# Patient Record
Sex: Male | Born: 1985
Health system: Southern US, Community
[De-identification: ages and names within clinical notes are randomized; demographics above are authoritative.]

## PROBLEM LIST (undated history)

## (undated) DIAGNOSIS — T7840XA Allergy, unspecified, initial encounter: Secondary | ICD-10-CM

## (undated) DIAGNOSIS — M5136 Other intervertebral disc degeneration, lumbar region: Secondary | ICD-10-CM

## (undated) DIAGNOSIS — R7303 Prediabetes: Secondary | ICD-10-CM

## (undated) HISTORY — DX: Allergy, unspecified, initial encounter: T78.40XA

---
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
## 2005-02-02 ENCOUNTER — Ambulatory Visit: Payer: Self-pay | Admitting: Family Medicine

## 2013-12-24 ENCOUNTER — Ambulatory Visit (INDEPENDENT_AMBULATORY_CARE_PROVIDER_SITE_OTHER): Payer: BC Managed Care – PPO | Admitting: Family Medicine

## 2013-12-24 VITALS — BP 142/65 | HR 89 | Temp 100.8°F | Resp 18 | Ht 66.5 in | Wt 298.0 lb

## 2013-12-24 DIAGNOSIS — J209 Acute bronchitis, unspecified: Secondary | ICD-10-CM

## 2013-12-24 LAB — POCT CBC
Granulocyte percent: 74.1 %G (ref 37–80)
HCT, POC: 39.3 % — AB (ref 43.5–53.7)
Hemoglobin: 12.2 g/dL — AB (ref 14.1–18.1)
Lymph, poc: 2.6 (ref 0.6–3.4)
MCH, POC: 27.4 pg (ref 27–31.2)
MCHC: 31 g/dL — AB (ref 31.8–35.4)
MCV: 88.1 fL (ref 80–97)
MID (cbc): 0.8 (ref 0–0.9)
MPV: 8.1 fL (ref 0–99.8)
POC Granulocyte: 9.6 — AB (ref 2–6.9)
POC LYMPH PERCENT: 19.8 %L (ref 10–50)
POC MID %: 6.1 %M (ref 0–12)
Platelet Count, POC: 435 10*3/uL — AB (ref 142–424)
RBC: 4.46 M/uL — AB (ref 4.69–6.13)
RDW, POC: 13.8 %
WBC: 13 10*3/uL — AB (ref 4.6–10.2)

## 2013-12-24 MED ORDER — AZITHROMYCIN 250 MG PO TABS: ORAL_TABLET | ORAL | Status: DC

## 2013-12-24 MED ORDER — HYDROCODONE-HOMATROPINE 5-1.5 MG/5ML PO SYRP: 5.0000 mL | ORAL_SOLUTION | Freq: Three times a day (TID) | ORAL | Status: DC | PRN

## 2013-12-24 NOTE — Addendum Note (Signed)
Addended by: Nita SellsSMITH, [place] S on: 12/24/2013 02:29 PM   Modules accepted: Orders

## 2013-12-24 NOTE — Patient Instructions (Signed)

## 2013-12-24 NOTE — Progress Notes (Signed)
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  Sometimes the cough is productive.  His boss recommended that he get checked out.  He has had some left shoulder soreness.  Nonsmoker, no h/o asthma  Objective:  NAD HEENT:  Scarred right TM, otherwise neg.  Throat is slightly reddened posteriorly Slightly hoarse Neck:  Without thyromegaly or adenopathy, supple Chest:  Few basilar rales, otherwise neg Cor:  No murmur, regular Skin:  No suspicious rashes  Acute bronchitis - Plan: azithromycin (ZITHROMAX Z-PAK) 250 MG tablet, HYDROcodone-homatropine (HYCODAN) 5-1.5 MG/5ML syrup  Signed, Elvina SidleKurt Camryn Lampson, MD

## 2013-12-27 ENCOUNTER — Telehealth: Payer: Self-pay

## 2013-12-27 ENCOUNTER — Encounter: Payer: Self-pay | Admitting: *Deleted

## 2013-12-27 NOTE — Telephone Encounter (Signed)
Letter written. Pt notified. Placed in pick up drawer.

## 2013-12-27 NOTE — Telephone Encounter (Signed)
Dr. Elbert EwingsL   Patient is requesting today out of work.  Medicine is working.  Gagging and coughing.  [phone]

## 2014-03-17 ENCOUNTER — Ambulatory Visit (INDEPENDENT_AMBULATORY_CARE_PROVIDER_SITE_OTHER): Payer: BC Managed Care – PPO | Admitting: Family Medicine

## 2014-03-17 VITALS — BP 102/82 | HR 79 | Temp 98.0°F | Resp 18 | Ht 66.5 in | Wt 297.2 lb

## 2014-03-17 DIAGNOSIS — J209 Acute bronchitis, unspecified: Secondary | ICD-10-CM

## 2014-03-17 DIAGNOSIS — R059 Cough, unspecified: Secondary | ICD-10-CM

## 2014-03-17 DIAGNOSIS — E669 Obesity, unspecified: Secondary | ICD-10-CM | POA: Insufficient documentation

## 2014-03-17 DIAGNOSIS — R05 Cough: Secondary | ICD-10-CM

## 2014-03-17 LAB — POCT CBC
Granulocyte percent: 57.4 %G (ref 37–80)
HCT, POC: 44 % (ref 43.5–53.7)
Hemoglobin: 14 g/dL — AB (ref 14.1–18.1)
Lymph, poc: 4.2 — AB (ref 0.6–3.4)
MCH, POC: 27.3 pg (ref 27–31.2)
MCHC: 31.8 g/dL (ref 31.8–35.4)
MCV: 86 fL (ref 80–97)
MID (cbc): 0.8 (ref 0–0.9)
MPV: 9.1 fL (ref 0–99.8)
POC Granulocyte: 6.7 (ref 2–6.9)
POC LYMPH PERCENT: 36 %L (ref 10–50)
POC MID %: 6.6 %M (ref 0–12)
Platelet Count, POC: 376 10*3/uL (ref 142–424)
RBC: 5.12 M/uL (ref 4.69–6.13)
RDW, POC: 14.6 %
WBC: 11.7 10*3/uL — AB (ref 4.6–10.2)

## 2014-03-17 MED ORDER — IPRATROPIUM BROMIDE 0.03 % NA SOLN: 2.0000 | Freq: Four times a day (QID) | NASAL | Status: DC

## 2014-03-17 MED ORDER — AZITHROMYCIN 250 MG PO TABS: ORAL_TABLET | ORAL | Status: DC

## 2014-03-17 NOTE — Patient Instructions (Addendum)
Try an OTC stool softener such as docusate sodium for your hard stools Use the nasal spray as needed, and the antibiotic.  Let me know if you do not feel better in the next few days.    Please work on your weight- your weight puts you at increased risk for diabetes, heart attack and stroke in the future.  Work towards losing 1 or 2 lbs a week

## 2014-03-17 NOTE — Progress Notes (Signed)
[facility] [address] [place] [address] [phone]  Date:  03/17/2014   Name:  Brian Harding   DOB:  December 26, 1985   MRN:  [id number]  PCP:  No PCP Per Patient    Chief Complaint: Cough and Nasal Congestion   History of Present Illness:  Brian Harding is a 28 y.o. very pleasant male patient who presents with the following:  Here today with illness. He has noted cough and sneeze at night, and nasal congestion during the night.  He "feels like I have a congestion in my chest."  He was seen here with bronchitis a few months ago.  He has not noted a fever and overall feels ok except for fatigue.   The cough is sometimes productive of mucus.  He can cough quite hard some of the time.   He does not note a ST or earache.  He has had some constipation recently- his stools have been hard.   He is a former smoker.   He has tried nyquil OTC.   He has noted some PND.    He is generally in good health  He has been trying to lose weight but is frustrated by yo-yo weight  There are no active problems to display for this patient.   History reviewed. No pertinent past medical history.  History reviewed. No pertinent past surgical history.  History  Substance Use Topics  . Smoking status: Former Games developer  . Smokeless tobacco: Not on file  . Alcohol Use: Yes    Family History  Problem Relation Age of Onset  . Diabetes Mother     No Known Allergies  Medication list has been reviewed and updated.  Current Outpatient Prescriptions on File Prior to Visit  Medication Sig Dispense Refill  . azithromycin (ZITHROMAX Z-PAK) 250 MG tablet Take as directed on pack  6 tablet  0  . HYDROcodone-homatropine (HYCODAN) 5-1.5 MG/5ML syrup Take 5 mLs by mouth every 8 (eight) hours as needed for cough.  120 mL  0   No current facility-administered medications on file prior to visit.    Review of Systems:  As per HPI- otherwise negative.   Physical Examination: Filed  Vitals:   03/17/14 1249  BP: 102/82  Pulse: 79  Temp: 98 F (36.7 C)  Resp: 18   Filed Vitals:   03/17/14 1249  Height: 5' 6.5" (1.689 m)  Weight: 297 lb 3.2 oz (134.809 kg)   Body mass index is 47.26 kg/(m^2). Ideal Body Weight: Weight in (lb) to have BMI = 25: 156.9  GEN: WDWN, NAD, Non-toxic, A & O x 3, obese, looks well HEENT: Atraumatic, Normocephalic. Neck supple. No masses, No LAD.  Bilateral TM wnl, oropharynx normal.  PEERL,EOMI.   Nasal congestion Ears and Nose: No external deformity. CV: RRR, No M/G/R. No JVD. No thrill. No extra heart sounds. PULM: CTA B, no wheezes, crackles, rhonchi. No retractions. No resp. distress. No accessory muscle use. ABD: S, NT, ND EXTR: No c/c/e NEURO Normal gait.  PSYCH: Normally interactive. Conversant. Not depressed or anxious appearing.  Calm demeanor.   Results for orders placed in visit on 03/17/14  POCT CBC      Result Value Ref Range   WBC 11.7 (*) 4.6 - 10.2 K/uL   Lymph, poc 4.2 (*) 0.6 - 3.4   POC LYMPH PERCENT 36.0  10 - 50 %L   MID (cbc) 0.8  0 - 0.9   POC MID % 6.6  0 - 12 %M   POC Granulocyte 6.7  2 - 6.9   Granulocyte percent 57.4  37 - 80 %G   RBC 5.12  4.69 - 6.13 M/uL   Hemoglobin 14.0 (*) 14.1 - 18.1 g/dL   HCT, POC 11.944.0  14.743.5 - 53.7 %   MCV 86.0  80 - 97 fL   MCH, POC 27.3  27 - 31.2 pg   MCHC 31.8  31.8 - 35.4 g/dL   RDW, POC 82.914.6     Platelet Count, POC 376  142 - 424 K/uL   MPV 9.1  0 - 99.8 fL    Assessment and Plan: Cough - Plan: POCT CBC, ipratropium (ATROVENT) 0.03 % nasal spray  Obesity, unspecified  Acute bronchitis - Plan: azithromycin (ZITHROMAX Z-PAK) 250 MG tablet   Treat for recurrent bronchitis with azithromycin.  Atrovent nasal as well for nasal sx.  Discussed weight loss strategies.  See patient instructions for more details.     Signed Abbe AmsterdamJessica Copland, MD

## 2014-06-17 ENCOUNTER — Emergency Department (HOSPITAL_BASED_OUTPATIENT_CLINIC_OR_DEPARTMENT_OTHER): Payer: BC Managed Care – PPO

## 2014-06-17 ENCOUNTER — Encounter (HOSPITAL_BASED_OUTPATIENT_CLINIC_OR_DEPARTMENT_OTHER): Payer: Self-pay | Admitting: Emergency Medicine

## 2014-06-17 ENCOUNTER — Emergency Department (HOSPITAL_BASED_OUTPATIENT_CLINIC_OR_DEPARTMENT_OTHER)
Admission: EM | Admit: 2014-06-17 | Discharge: 2014-06-17 | Disposition: A | Discharge status: Home / Self Care | Payer: BC Managed Care – PPO | Attending: Emergency Medicine | Admitting: Emergency Medicine

## 2014-06-17 DIAGNOSIS — J40 Bronchitis, not specified as acute or chronic: Secondary | ICD-10-CM

## 2014-06-17 DIAGNOSIS — J069 Acute upper respiratory infection, unspecified: Secondary | ICD-10-CM | POA: Diagnosis present

## 2014-06-17 DIAGNOSIS — Z87891 Personal history of nicotine dependence: Secondary | ICD-10-CM | POA: Diagnosis not present

## 2014-06-17 DIAGNOSIS — J209 Acute bronchitis, unspecified: Secondary | ICD-10-CM | POA: Insufficient documentation

## 2014-06-17 DIAGNOSIS — R0789 Other chest pain: Secondary | ICD-10-CM | POA: Insufficient documentation

## 2014-06-17 DIAGNOSIS — Z79899 Other long term (current) drug therapy: Secondary | ICD-10-CM | POA: Diagnosis not present

## 2014-06-17 MED ORDER — ALBUTEROL SULFATE HFA 108 (90 BASE) MCG/ACT IN AERS: 1.0000 | INHALATION_SPRAY | Freq: Four times a day (QID) | RESPIRATORY_TRACT | Status: DC | PRN

## 2014-06-17 MED ORDER — ALBUTEROL SULFATE (2.5 MG/3ML) 0.083% IN NEBU
5.0000 mg | INHALATION_SOLUTION | Freq: Once | RESPIRATORY_TRACT | Status: AC
  Administered 2014-06-17: 5 mg via RESPIRATORY_TRACT
  Filled 2014-06-17: qty 6

## 2014-06-17 MED ORDER — OXYMETAZOLINE HCL 0.05 % NA SOLN
1.0000 | Freq: Two times a day (BID) | NASAL | Status: DC
Start: 2014-06-17 — End: 2014-09-03

## 2014-06-17 MED ORDER — AZITHROMYCIN 250 MG PO TABS: ORAL_TABLET | ORAL | Status: DC

## 2014-06-17 NOTE — ED Notes (Signed)
Pt c/o URi symtpoms x 2 days

## 2014-06-17 NOTE — ED Provider Notes (Signed)
CSN: [id number]     Arrival date & time 06/17/14  2148 History   This chart was scribed for Brian Canal, MD by Roxy Cedar, ED Scribe. This patient was seen in room MH01/MH01 and the patient's care was started at 10:45 PM.  Chief Complaint  Patient presents with  . URI   The history is provided by the patient. No language interpreter was used.    HPI Comments: Brian Harding is a 28 y.o. male who presents to the Emergency Department complaining of congestion in upper respiratory and chest that began 2 days ago.  Patient states that he has a productive cough with yellow and green mucous. Patient states that he was a former smoker and currently uses a vapor. Patient states that he was treated for bronchitis a few months ago with steroid and antibiotic medication. Patient denies associated fever.   History reviewed. No pertinent past medical history. History reviewed. No pertinent past surgical history. Family History  Problem Relation Age of Onset  . Diabetes Mother    History  Substance Use Topics  . Smoking status: Former Games developer  . Smokeless tobacco: Not on file  . Alcohol Use: Yes    Review of Systems  Constitutional: Negative for fever.  Respiratory: Positive for cough, chest tightness and wheezing.   All other systems reviewed and are negative.   Allergies  Review of patient's allergies indicates no known allergies.  Home Medications   Prior to Admission medications   Medication Sig Start Date End Date Taking? Authorizing Provider  azithromycin (ZITHROMAX Z-PAK) 250 MG tablet Take as directed on pack 03/17/14   Gwenlyn Found Copland, MD  ipratropium (ATROVENT) 0.03 % nasal spray Place 2 sprays into the nose 4 (four) times daily. 03/17/14   Pearline Cables, MD   Triage Vitals: BP 123/73  Pulse 76  Temp(Src) 98 F (36.7 C) (Oral)  Resp 16  Ht  (1.676 m)  Wt 292 lb (132.45 kg)  BMI 47.15 kg/m2  SpO2 97%  Physical Exam  Nursing note and vitals  reviewed. Constitutional: He is oriented to person, place, and time. He appears well-developed and well-nourished. No distress.  HENT:  Head: Normocephalic and atraumatic.  Eyes: Conjunctivae and EOM are normal.  Neck: Neck supple. No tracheal deviation present.  Cardiovascular: Normal rate.   Pulmonary/Chest: Effort normal. He has wheezes.  Moderate air movement, minimal wheezing throughou  Musculoskeletal: Normal range of motion.  Neurological: He is alert and oriented to person, place, and time.  Skin: Skin is warm and dry.  Psychiatric: He has a normal mood and affect. His behavior is normal.    ED Course  Procedures (including critical care time)  DIAGNOSTIC STUDIES: Oxygen Saturation is 97% on RA, normal by my interpretation.    COORDINATION OF CARE: 10:48 PM- Discussed plan to administer nebulizer treatment. Pt advised of plan for treatment and pt agrees.  Labs Review Labs Reviewed - No data to display  Imaging Review Dg Chest 2 View  06/17/2014   CLINICAL DATA:  Upper respiratory infection  EXAM: CHEST  2 VIEW  COMPARISON:  None.  FINDINGS: The cardiac and mediastinal silhouettes are within normal limits.  The lungs are normally inflated. No airspace consolidation, pleural effusion, or pulmonary edema is identified. There is no pneumothorax.  No acute osseous abnormality identified.  IMPRESSION: No active cardiopulmonary disease.   Electronically Signed   By: Rise Mu M.D.   On: 06/17/2014 23:14     EKG Interpretation None  MDM   Final diagnoses:  None   Brian Harding is a 28 y.o. male here with cough, congestion. Minimal wheezing initially, improved after neb. Vitals stable. I think he has bronchitis. Will d/c with zpack, albuterol, pmd f/u.    I personally performed the services described in this documentation, which was scribed in my presence. The recorded information has been reviewed and is accurate.   Brian Canal, MD 06/17/14 2325

## 2014-06-17 NOTE — Discharge Instructions (Signed)
Take afrin for congestion.   Stay hydrated.  Use zpack as directed.   Use albuterol as needed for wheezing and trouble breathing.   Follow up with your doctor.   Return to ER if you have trouble breathing, severe pain, worse congestion.

## 2014-07-03 ENCOUNTER — Ambulatory Visit (INDEPENDENT_AMBULATORY_CARE_PROVIDER_SITE_OTHER): Payer: BC Managed Care – PPO | Admitting: Family Medicine

## 2014-07-03 ENCOUNTER — Ambulatory Visit (INDEPENDENT_AMBULATORY_CARE_PROVIDER_SITE_OTHER): Payer: BC Managed Care – PPO

## 2014-07-03 VITALS — BP 134/80 | HR 88 | Temp 98.2°F | Resp 17 | Ht 67.5 in | Wt 298.0 lb

## 2014-07-03 DIAGNOSIS — M545 Low back pain, unspecified: Secondary | ICD-10-CM

## 2014-07-03 DIAGNOSIS — R05 Cough: Secondary | ICD-10-CM

## 2014-07-03 DIAGNOSIS — J029 Acute pharyngitis, unspecified: Secondary | ICD-10-CM

## 2014-07-03 DIAGNOSIS — R0989 Other specified symptoms and signs involving the circulatory and respiratory systems: Secondary | ICD-10-CM

## 2014-07-03 DIAGNOSIS — M25561 Pain in right knee: Secondary | ICD-10-CM

## 2014-07-03 DIAGNOSIS — R5383 Other fatigue: Secondary | ICD-10-CM

## 2014-07-03 DIAGNOSIS — R109 Unspecified abdominal pain: Secondary | ICD-10-CM

## 2014-07-03 DIAGNOSIS — R0609 Other forms of dyspnea: Secondary | ICD-10-CM

## 2014-07-03 DIAGNOSIS — R0600 Dyspnea, unspecified: Secondary | ICD-10-CM

## 2014-07-03 DIAGNOSIS — R059 Cough, unspecified: Secondary | ICD-10-CM

## 2014-07-03 DIAGNOSIS — M25569 Pain in unspecified knee: Secondary | ICD-10-CM

## 2014-07-03 DIAGNOSIS — R5381 Other malaise: Secondary | ICD-10-CM

## 2014-07-03 LAB — POCT UA - MICROSCOPIC ONLY
Bacteria, U Microscopic: NEGATIVE
CRYSTALS, UR, HPF, POC: NEGATIVE
Casts, Ur, LPF, POC: NEGATIVE
Mucus, UA: NEGATIVE
RBC, URINE, MICROSCOPIC: NEGATIVE
WBC, Ur, HPF, POC: NEGATIVE
Yeast, UA: NEGATIVE

## 2014-07-03 LAB — POCT URINALYSIS DIPSTICK
Bilirubin, UA: NEGATIVE
Blood, UA: NEGATIVE
Glucose, UA: NEGATIVE
KETONES UA: NEGATIVE
Leukocytes, UA: NEGATIVE
Nitrite, UA: NEGATIVE
PROTEIN UA: NEGATIVE
SPEC GRAV UA: 1.02
Urobilinogen, UA: 0.2
pH, UA: 7

## 2014-07-03 LAB — POCT RAPID STREP A (OFFICE): Rapid Strep A Screen: NEGATIVE

## 2014-07-03 LAB — POCT CBC
GRANULOCYTE PERCENT: 60 % (ref 37–80)
HCT, POC: 42.8 % — AB (ref 43.5–53.7)
Hemoglobin: 13.9 g/dL — AB (ref 14.1–18.1)
Lymph, poc: 2.8 (ref 0.6–3.4)
MCH, POC: 27.6 pg (ref 27–31.2)
MCHC: 32.6 g/dL (ref 31.8–35.4)
MCV: 84.9 fL (ref 80–97)
MID (CBC): 0.6 (ref 0–0.9)
MPV: 7.5 fL (ref 0–99.8)
PLATELET COUNT, POC: 277 10*3/uL (ref 142–424)
POC GRANULOCYTE: 5.2 (ref 2–6.9)
POC LYMPH PERCENT: 33 %L (ref 10–50)
POC MID %: 7 % (ref 0–12)
RBC: 5.05 M/uL (ref 4.69–6.13)
RDW, POC: 14.3 %
WBC: 8.6 10*3/uL (ref 4.6–10.2)

## 2014-07-03 MED ORDER — MELOXICAM 7.5 MG PO TABS: 7.5000 mg | ORAL_TABLET | Freq: Every day | ORAL | Status: DC | PRN

## 2014-07-03 NOTE — Progress Notes (Signed)
Subjective:    Patient ID: Brian Harding, male    DOB: 1986/02/15, 28 y.o.   MRN: [id number]  HPI Samin Milke is a 28 y.o. male PCP: No PCP Per Patient Here with multiple concerns:   Presents with 4 day history of burning in throat, lower back pain on left, sneezing - hard sneezes, and occasional wheezes. Today at work - sweating, clammy feeling, sore throat/burning. Used albuterol once yesterday - helped some with wheeze. Feels shaky with taking inhaler. Short of breath walking around last night. Concerned about clammy feeling, subjective fever. No vomiting.  Upset stomach, few episodes of loose stools recently. No hematuria, or difficulty urinating, but has noticed darker urine recently. Drinking fluids, tea. New lower left back pain past few days, but has had some low back pain for years. Lower left back pain and clammy feeling are most concerning symptoms. No known sick contacts.  Seen in the ER about a month ago - treated for bronchitis, rx zpak and albuterol inhaler.   No bowel or bladder incontinence, no saddle anesthesia, no lower extremity weakness.   Knee pain - R knee, injured in high school - torn ligament? in 9th grade, bruised cartilege and growth plate? Did not have surgery. Pain in R knee for past few months, pain in front part of knee at times. Pops with standing or sitting down at times.  Feels more pain after standing for some time. Possible instability at times. 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  Exercise - able to squats and leg presses. Tx: occasional tylenol. Over the counter knee brace helps.   Some difficulty with history - clarification with follow up questioning.  Patient Active Problem List   Diagnosis Date Noted  . Obesity, unspecified 03/17/2014   No past medical history on file. No past surgical history on file. No Known Allergies Prior to Admission medications   Medication Sig Start Date End Date Taking? Authorizing Provider  albuterol (PROVENTIL HFA;VENTOLIN HFA)  108 (90 BASE) MCG/ACT inhaler Inhale 1-2 puffs into the lungs every 6 (six) hours as needed for wheezing or shortness of breath. 06/17/14  Yes [name], MD  azithromycin (ZITHROMAX Z-PAK) 250 MG tablet Take as directed on pack 03/17/14  Yes Gwenlyn Found Copland, MD  azithromycin (ZITHROMAX Z-PAK) 250 MG tablet 2 po day one, then 1 daily x 4 days 06/17/14  Yes [name], MD  ipratropium (ATROVENT) 0.03 % nasal spray Place 2 sprays into the nose 4 (four) times daily. 03/17/14  Yes Gwenlyn Found Copland, MD  oxymetazoline (AFRIN NASAL SPRAY) 0.05 % nasal spray Place 1 spray into both nostrils 2 (two) times daily. 06/17/14  Yes [name], MD   History   Social History  . Marital Status: Single    Spouse Name: N/A    Number of Children: N/A  . Years of Education: N/A   Occupational History  . Not on file.   Social History Main Topics  . Smoking status: Former Games developer  . Smokeless tobacco: Not on file  . Alcohol Use: Yes  . Drug Use: No  . Sexual Activity: Not on file   Other Topics Concern  . Not on file   Social History Narrative  . No narrative on file       Review of Systems  Constitutional: Positive for chills and diaphoresis. Negative for fever.  HENT: Positive for sneezing and sore throat.   Respiratory: Positive for shortness of breath and wheezing.   Gastrointestinal: Positive for nausea.  Negative for vomiting and abdominal pain (upset stomach, not painful. ).  Musculoskeletal: Positive for arthralgias (r knee), back pain and myalgias.  Skin: Negative for rash.       Objective:   Physical Exam  Vitals reviewed. Constitutional: He is oriented to person, place, and time. He appears well-developed and well-nourished. No distress.  Overweight/obese  HENT:  Head: Normocephalic and atraumatic.  Right Ear: Tympanic membrane, external ear and ear canal normal.  Left Ear: Tympanic membrane, external ear and ear canal normal.  Nose: No rhinorrhea.  Mouth/Throat: Oropharynx is  clear and moist and mucous membranes are normal. No oropharyngeal exudate or posterior oropharyngeal erythema.  Eyes: Conjunctivae are normal. Pupils are equal, round, and reactive to light.  Neck: Normal range of motion. Neck supple.  Cardiovascular: Normal rate, regular rhythm, normal heart sounds and intact distal pulses.   No murmur heard. Pulmonary/Chest: Effort normal and breath sounds normal. He has no wheezes. He has no rhonchi. He has no rales.  Abdominal: Soft. There is no tenderness. There is CVA tenderness (more into L paraspinal mm than flank. ).  Musculoskeletal: He exhibits tenderness.       Right knee: He exhibits bony tenderness and abnormal meniscus (clicking with Mcmurray, but felt to be below patella. ). He exhibits normal range of motion, no swelling, no effusion and no deformity. Tenderness (patellar tendon, medial jt line. negative clarks, but slight discomfort with patellar grind. ) found. Medial joint line and patellar tendon tenderness noted.       Lumbar back: He exhibits tenderness. He exhibits normal range of motion and no bony tenderness.       Back:  Lymphadenopathy:    He has no cervical adenopathy.  Neurological: He is alert and oriented to person, place, and time. He has normal strength. Tremors: negative SLR.  No sensory deficit.  Reflex Scores:      Patellar reflexes are 2+ on the right side and 2+ on the left side.      Achilles reflexes are 2+ on the right side and 2+ on the left side. Able to heel and toe walk without difficulty.  Skin: Skin is warm and dry. No rash noted. He is not diaphoretic.  Psychiatric: He has a normal mood and affect. His behavior is normal.   Filed Vitals:   07/03/14 1118  BP: 134/80  Pulse: 88  Temp: 98.2 F (36.8 C)  TempSrc: Oral  Resp: 17  Height: 5' 7.5" (1.715 m)  Weight: 298 lb (135.172 kg)  SpO2: 98%   Results for orders placed in visit on 07/03/14  POCT RAPID STREP A (OFFICE)      Result Value Ref Range    Rapid Strep A Screen Negative  Negative  POCT UA - MICROSCOPIC ONLY      Result Value Ref Range   WBC, Ur, HPF, POC neg     RBC, urine, microscopic neg     Bacteria, U Microscopic neg     Mucus, UA neg     Epithelial cells, urine per micros 0-1     Crystals, Ur, HPF, POC neg     Casts, Ur, LPF, POC neg     Yeast, UA neg    POCT URINALYSIS DIPSTICK      Result Value Ref Range   Color, UA yellow     Clarity, UA clear     Glucose, UA neg     Bilirubin, UA neg     Ketones, UA neg  Spec Grav, UA 1.020     Blood, UA neg     pH, UA 7.0     Protein, UA neg     Urobilinogen, UA 0.2     Nitrite, UA neg     Leukocytes, UA Negative    POCT CBC      Result Value Ref Range   WBC 8.6  4.6 - 10.2 K/uL   Lymph, poc 2.8  0.6 - 3.4   POC LYMPH PERCENT 33.0  10 - 50 %L   MID (cbc) 0.6  0 - 0.9   POC MID % 7.0  0 - 12 %M   POC Granulocyte 5.2  2 - 6.9   Granulocyte percent 60.0  37 - 80 %G   RBC 5.05  4.69 - 6.13 M/uL   Hemoglobin 13.9 (*) 14.1 - 18.1 g/dL   HCT, POC 40.9 (*) 81.1 - 53.7 %   MCV 84.9  80 - 97 fL   MCH, POC 27.6  27 - 31.2 pg   MCHC 32.6  31.8 - 35.4 g/dL   RDW, POC 91.4     Platelet Count, POC 277  142 - 424 K/uL   MPV 7.5  0 - 99.8 fL   [facility] reading (PRIMARY) by  Dr. Neva Seat: R knee: lateral patellar tilt, possible small osteochondral defect of lateral femoral condyle. No acute findings.      Assessment & Plan:   Anne Sebring is a 28 y.o. male Cough , Dyspnea,  - Malaise and fatigue, Sore throat POCT CBC, Culture, Group A Strep,  -suspected viral illness. Reassuring VS including normal pulse ox, and CBC overall wnl. Sx care discussed. If wheezing or dyspnea returns - has albuterol and discussed trial of 1-2 puffs as needed - SED. Other sx care per AVS and RTC precautions discussed.   Low back pain, Left flank pain - Plan: POCT UA - Microscopic Only, POCT urinalysis dipstick, POCT CBC, Culture, Group A Strep, - Plan: POCT CBC, Culture, Group A Strep, meloxicam  (MOBIC) 7.5 MG tablet  -paraspinal strain, possibly form cough.  Reassuring U/A and exam. Sx care, back care manual given and mobic if needed. rtc if persists.    Knee pain, right - Plan: POCT CBC, Culture, Group A Strep, meloxicam (MOBIC) 7.5 MG tablet -longstanding. 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 Trial of mobic, otc knee brace if needed and recheck if not improving in next few weeks.   Borderline anemia - recheck HGB in 4-6 weeks. Sooner if symptomatic, or above sx's worsen.   Meds ordered this encounter  Medications  . meloxicam (MOBIC) 7.5 MG tablet    Sig: Take 1 tablet (7.5 mg total) by mouth daily as needed for pain.    Dispense:  30 tablet    Refill:  0   Patient Instructions  Your tests indicate this is most likely a virus. Tylenol, sore throat lozenges, fluids and rest are important.  If you do feel short of breath or wheezing - can use inhaler - 1 to 2 puffs up to every 4 hours if needed. Return to the clinic or go to the nearest emergency room if any of your symptoms worsen or new symptoms occur. You should receive a call or letter about your lab results(throat culture) within the next week to 10 days.   Hemoglobin (blood  count) borderline low, but close to normal.  Can recheck this with a primary care provider or here in next 4-6 weeks.     Viral Infections A viral infection can be caused by different types of viruses.Most viral infections are not serious and resolve on their own. However, some infections may cause severe symptoms and may lead to further complications. SYMPTOMS Viruses can frequently cause:  Minor sore throat.  Aches and pains.  Headaches.  Runny nose.  Different types of rashes.  Watery eyes.  Tiredness.  Cough.  Loss of appetite.  Gastrointestinal infections, resulting  in nausea, vomiting, and diarrhea. These symptoms do not respond to antibiotics because the infection is not caused by bacteria. However, you might catch a bacterial infection following the viral infection. This is sometimes called a "superinfection." Symptoms of such a bacterial infection may include:  Worsening sore throat with pus and difficulty swallowing.  Swollen neck glands.  Chills and a high or persistent fever.  Severe headache.  Tenderness over the sinuses.  Persistent overall ill feeling (malaise), muscle aches, and tiredness (fatigue).  Persistent cough.  Yellow, green, or brown mucus production with coughing. HOME CARE INSTRUCTIONS   Only take over-the-counter or prescription medicines for pain, discomfort, diarrhea, or fever as directed by your caregiver.  Drink enough water and fluids to keep your urine clear or pale yellow. Sports drinks can provide valuable electrolytes, sugars, and hydration.  Get plenty of rest and maintain proper nutrition. Soups and broths with crackers or rice are fine. SEEK IMMEDIATE MEDICAL CARE IF:   You have severe headaches, shortness of breath, chest pain, neck pain, or an unusual rash.  You have uncontrolled vomiting, diarrhea, or you are unable to keep down fluids.  You or your child has an oral temperature above 102 F (38.9 C), not controlled by medicine.  Your baby is older than 3 months with a rectal temperature of 102 F (38.9 C) or higher.  Your baby is 40 months old or younger with a rectal temperature of 100.4 F (38 C) or higher. MAKE SURE YOU:   Understand these instructions.  Will watch your condition.  Will get help right away if you are not doing well or get worse. Document Released: 07/13/2005 Document Revised: 12/26/2011 Document Reviewed: 02/07/2011 [facility] Patient Information 2015 [place], [place]. This information is not intended to replace advice given to you by your health care provider. Make sure you  discuss any questions you have with your health care provider.    Sore Throat A sore throat is pain, burning, irritation, or scratchiness of the throat. There is often pain or tenderness when swallowing or talking. A sore throat may be accompanied by other symptoms, such as coughing, sneezing, fever, and swollen neck glands. A sore throat is often the first sign of another sickness, such as a cold, flu, strep throat, or mononucleosis (commonly known as mono). Most sore throats go away without medical treatment. CAUSES  The most common causes of a sore throat include:  A viral infection, such as a cold, flu, or mono.  A bacterial infection, such as strep throat, tonsillitis, or whooping cough.  Seasonal allergies.  Dryness in the air.  Irritants, such as smoke or pollution.  Gastroesophageal reflux disease (GERD). HOME CARE INSTRUCTIONS   Only take over-the-counter medicines as directed by your caregiver.  Drink enough fluids to keep your urine clear or pale yellow.  Rest as needed.  Try using throat sprays, lozenges, or sucking on hard candy to  ease any pain (if older than 4 years or as directed).  Sip warm liquids, such as broth, herbal tea, or warm water with honey to relieve pain temporarily. You may also eat or drink cold or frozen liquids such as frozen ice pops.  Gargle with salt water (mix 1 tsp salt with 8 oz of water).  Do not smoke and avoid secondhand smoke.  Put a cool-mist humidifier in your bedroom at night to moisten the air. You can also turn on a hot shower and sit in the bathroom with the door closed for 5-10 minutes. SEEK IMMEDIATE MEDICAL CARE IF:  You have difficulty breathing.  You are unable to swallow fluids, soft foods, or your saliva.  You have increased swelling in the throat.  Your sore throat does not get better in 7 days.  You have nausea and vomiting.  You have a fever or persistent symptoms for more than 2-3 days.  You have a fever  and your symptoms suddenly get worse. MAKE SURE YOU:   Understand these instructions.  Will watch your condition.  Will get help right away if you are not doing well or get worse. Document Released: 11/10/2004 Document Revised: 09/19/2012 Document Reviewed: 06/10/2012 [facility] Patient Information 2015 [place], [place]. This information is not intended to replace advice given to you by your health care provider. Make sure you discuss any questions you have with your health care provider.  For your back - I suspect you pulled a muscle in low back.  See booklet for more information and exercises and if not improving in next 1-2 weeks or worse sooner - return for recheck.   For your knee - can try the new pain/inflammation medicine once per day, and exercises as below. Avoid exercises in the gym that worsen this pain.   Ok to use brace if needed, but if not improving in next 2-4 weeks (or worse sooner) can refer you to orthopaedic doctor. There was a possible small area seen form prior injury, but would need MRI to look at ligaments and cartilage further.   Patellar Tendinitis, Jumper's Knee with Rehab Tendinitis is inflammation of a tendon. Tendonitis of the tendon below the kneecap (patella) is known as patellar tendonitis. Patellar tendonitis is a common cause of pain below the kneecap (infrapatellar). Patellar tendonitis may involve a tear (strain) in the ligament. Strains are classified into three categories. Grade 1 strains cause pain, but the tendon is not lengthened. Grade 2 strains include a lengthened ligament, due to the ligament being stretched or partially ruptured. With grade 2 strains there is still function, although function may be decreased. Grade 3 strains involve a complete tear of the tendon or muscle, and function is usually impaired. Patellar tendon strains are usually grade 1 or 2.  SYMPTOMS   Pain, tenderness, swelling, warmth, or redness over the patellar tendon (just below  the kneecap).  Pain and loss of strength (sometimes), with forcefully straightening the knee (especially when jumping or rising from a seated or squatting position), or bending the knee completely (squatting or kneeling).  Crackling sound (crepitation) when the tendon is moved or touched. CAUSES  Patellar tendonitis is caused by injury to the patellar tendon. The inflammation is the body's healing response. Common causes of injury include:  Stress from a sudden increase in intensity, frequency, or duration of training.  Overuse of the thigh muscles (quadriceps) and patellar tendon.  Direct hit (trauma) to the knee or patellar tendon. RISK INCREASES WITH:  Sports that  require sudden, explosive quadriceps contraction, such as jumping, quick starts, or kicking.  Running sports, especially running down hills.  Poor strength and flexibility of the thigh and knee.  Flat feet. PREVENTION  Warm up and stretch properly before activity.  Allow for adequate recovery between workouts.  Maintain physical fitness:  Strength, flexibility, and endurance.  Cardiovascular fitness.  Protect the knee joint with taping, protective strapping, bracing, or elastic compression bandage.  Wear arch supports (orthotics). PROGNOSIS  If treated properly, patellar tendonitis usually heals within 6 weeks.  RELATED COMPLICATIONS   Longer healing time if not properly treated or if not given enough time to heal.  Recurring symptoms if activity is resumed too soon, with overuse, with a direct blow, or when using poor technique.  If untreated, tendon rupture requiring surgery. TREATMENT Treatment first involves the use of ice and medicine to reduce pain and inflammation. The use of strengthening and stretching exercises may help reduce pain with activity. These exercises may be performed at home or with a therapist. Serious cases of tendonitis may require restraining the knee for 10 to 14 days to prevent  stress on the tendon and to promote healing. Crutches may be used (uncommon) until you can walk without a limp. For cases in which nonsurgical treatment is unsuccessful, surgery may be advised to remove the inflamed tendon lining (sheath). Surgery is rare, and is only advised after at least 6 months of nonsurgical treatment. MEDICATION   If pain medicine is needed, nonsteroidal anti-inflammatory medicines (aspirin and ibuprofen), or other minor pain relievers (acetaminophen), are often advised.  Do not take pain medicine for 7 days before surgery.  Prescription pain relievers may be given if your caregiver thinks they are needed. Use only as directed and only as much as you need. HEAT AND COLD  Cold treatment (icing) should be applied for 10 to 15 minutes every 2 to 3 hours for inflammation and pain, and immediately after activity that aggravates your symptoms. Use ice packs or an ice massage.  Heat treatment may be used before performing stretching and strengthening activities prescribed by your caregiver, physical therapist, or athletic trainer. Use a heat pack or a warm water soak. SEEK MEDICAL CARE IF:  Symptoms get worse or do not improve in 2 weeks, despite treatment.  New, unexplained symptoms develop. (Drugs used in treatment may produce side effects.) EXERCISES RANGE OF MOTION (ROM) AND STRETCHING EXERCISES - Patellar Tendinitis (Jumper's Knee) These are some of the initial exercises with which you may start your rehabilitation program, until you see your caregiver again or until your symptoms are resolved. Remember:   Flexible tissue is more tolerant of the stresses placed on it during activities.  Each stretch should be held for 20 to 30 seconds.  A gentle stretching sensation should be felt. STRETCH - Hamstrings, Supine  Lie on your back. Loop a belt or towel over the ball of your right / left foot.  Straighten your right / left knee and slowly pull on the belt to raise  your leg. Do not allow the right / left knee to bend. Keep your opposite leg flat on the floor.  Raise the leg until you feel a gentle stretch behind your right / left knee or thigh. Hold this position for __________ seconds. Repeat __________ times. Complete this stretch __________ times per day.  STRETCH - Hamstrings, Doorway  Lie on your back with your right / left leg extended and resting on the wall, and the opposite leg  flat on the ground through the door. At first, position your bottom farther away from the wall.  Keep your right / left knee straight. If you feel a stretch behind your knee or thigh, hold this position for __________ seconds.  If you do not feel a stretch, scoot your bottom closer to the door, and hold __________ seconds. Repeat __________ times. Complete this stretch __________ times per day.  STRETCH - Hamstrings, Standing  Stand or sit and extend your right / left leg, placing your foot on a chair or foot stool.  Keep a slight arch in your low back and your hips straight forward.  Lead with your chest and lean forward at the waist until you feel a gentle stretch in the back of your right / left knee or thigh. (When done correctly, this exercise requires leaning only a small distance.)  Hold this position for __________ seconds. Repeat __________ times. Complete this stretch __________ times per day. STRETCH - Adductors, Lunge  While standing, spread your legs, with your right / left leg behind you.  Lean away from your right / left leg by bending your opposite knee. You may rest your hands on your thigh for balance.  You should feel a stretch in your right / left inner thigh. Hold for __________ seconds. Repeat __________ times. Complete this exercise __________ times per day.  STRENGTHENING EXERCISES - Patellar Tendinitis (Jumper's Knee) These exercises may help you when beginning to rehabilitate your injury. They may resolve your symptoms with or without  further involvement from your physician, physical therapist or athletic trainer. While completing these exercises, remember:   Muscles can gain both the endurance and the strength needed for everyday activities through controlled exercises.  Complete these exercises as instructed by your physician, physical therapist or athletic trainer. Increase the resistance and repetitions only as guided by your caregiver. STRENGTH - Quadriceps, Isometrics  Lie on your back with your right / left leg extended and your opposite knee bent.  Gradually tense the muscles in the front of your right / left thigh. You should see either your kneecap slide up toward your hip or increased dimpling just above the knee. This motion will push the back of the knee down toward the floor, mat, or bed on which you are lying.  Hold the muscle as tight as you can, without increasing your pain, for __________ seconds.  Relax the muscles slowly and completely in between each repetition. Repeat __________ times. Complete this exercise __________ times per day.  STRENGTH - Quadriceps, Short Arcs  Lie on your back. Place a __________ inch towel roll under your right / left knee, so that the knee bends slightly.  Raise only your lower leg by tightening the muscles in the front of your thigh. Do not allow your thigh to rise.  Hold this position for __________ seconds. Repeat __________ times. Complete this exercise __________ times per day.  OPTIONAL ANKLE WEIGHTS: Begin with ____________________, but DO NOT exceed ____________________. Increase in 1 pound/ 0.5 kilogram increments. STRENGTH - Quadriceps, Straight Leg Raises  Quality counts! Watch for signs that the quadriceps muscle is working, to be sure you are strengthening the correct muscles and not "cheating" by substituting with healthier muscles.  Lay on your back with your right / left leg extended and your opposite knee bent.  Tense the muscles in the front of your  right / left thigh. You should see either your kneecap slide up or increased dimpling just above the knee.  Your thigh may even shake a bit.  Tighten these muscles even more and raise your leg 4 to 6 inches off the floor. Hold for __________ seconds.  Keeping these muscles tense, lower your leg.  Relax the muscles slowly and completely between each repetition. Repeat __________ times. Complete this exercise __________ times per day.  STRENGTH - Quadriceps, Squats  Stand in a door frame so that your feet and knees are in line with the frame.  Use your hands for balance, not support, on the frame.  Slowly lower your weight, bending at the hips and knees. Keep your lower legs upright so that they are parallel with the door frame. Squat only within the range that does not increase your knee pain. Never let your hips drop below your knees.  Slowly return upright, pushing with your legs, not pulling with your hands. Repeat __________ times. Complete this exercise __________ times per day.  STRENGTH - Quadriceps, Step-Downs  Stand on the edge of a step stool or stair. Be prepared to use a countertop or wall for balance, if needed.  Keeping your right / left knee directly over the middle of your foot, slowly touch your opposite heel to the floor or lower step. Do not go all the way to the floor if your knee pain increases; just go as far as you can without increased discomfort. Use your right / left leg muscles, not gravity to lower your body weight.  Slowly push your body weight back up to the starting position. Repeat __________ times. Complete this exercise __________ times per day.  Document Released: 10/03/2005 Document Revised: 02/17/2014 Document Reviewed: 01/15/2009 [facility] Patient Information 2015 [place], [place]. This information is not intended to replace advice given to you by your health care provider. Make sure you discuss any questions you have with your health care  provider.

## 2014-07-03 NOTE — Patient Instructions (Addendum)
Your tests indicate this is most likely a virus. Tylenol, sore throat lozenges, fluids and rest are important.  If you do feel short of breath or wheezing - can use inhaler - 1 to 2 puffs up to every 4 hours if needed. Return to the clinic or go to the nearest emergency room if any of your symptoms worsen or new symptoms occur. You should receive a call or letter about your lab results(throat culture) within the next week to 10 days.   Hemoglobin (blood count) borderline low, but close to normal.  Can recheck this with a primary care provider or here in next 4-6 weeks.     Viral Infections A viral infection can be caused by different types of viruses.Most viral infections are not serious and resolve on their own. However, some infections may cause severe symptoms and may lead to further complications. SYMPTOMS Viruses can frequently cause:  Minor sore throat.  Aches and pains.  Headaches.  Runny nose.  Different types of rashes.  Watery eyes.  Tiredness.  Cough.  Loss of appetite.  Gastrointestinal infections, resulting in nausea, vomiting, and diarrhea. These symptoms do not respond to antibiotics because the infection is not caused by bacteria. However, you might catch a bacterial infection following the viral infection. This is sometimes called a "superinfection." Symptoms of such a bacterial infection may include:  Worsening sore throat with pus and difficulty swallowing.  Swollen neck glands.  Chills and a high or persistent fever.  Severe headache.  Tenderness over the sinuses.  Persistent overall ill feeling (malaise), muscle aches, and tiredness (fatigue).  Persistent cough.  Yellow, green, or brown mucus production with coughing. HOME CARE INSTRUCTIONS   Only take over-the-counter or prescription medicines for pain, discomfort, diarrhea, or fever as directed by your caregiver.  Drink enough water and fluids to keep your urine clear or pale yellow. Sports  drinks can provide valuable electrolytes, sugars, and hydration.  Get plenty of rest and maintain proper nutrition. Soups and broths with crackers or rice are fine. SEEK IMMEDIATE MEDICAL CARE IF:   You have severe headaches, shortness of breath, chest pain, neck pain, or an unusual rash.  You have uncontrolled vomiting, diarrhea, or you are unable to keep down fluids.  You or your child has an oral temperature above 102 F (38.9 C), not controlled by medicine.  Your baby is older than 3 months with a rectal temperature of 102 F (38.9 C) or higher.  Your baby is 4 months old or younger with a rectal temperature of 100.4 F (38 C) or higher. MAKE SURE YOU:   Understand these instructions.  Will watch your condition.  Will get help right away if you are not doing well or get worse. Document Released: 07/13/2005 Document Revised: 12/26/2011 Document Reviewed: 02/07/2011 [facility] Patient Information 2015 [place], [place]. This information is not intended to replace advice given to you by your health care provider. Make sure you discuss any questions you have with your health care provider.    Sore Throat A sore throat is pain, burning, irritation, or scratchiness of the throat. There is often pain or tenderness when swallowing or talking. A sore throat may be accompanied by other symptoms, such as coughing, sneezing, fever, and swollen neck glands. A sore throat is often the first sign of another sickness, such as a cold, flu, strep throat, or mononucleosis (commonly known as mono). Most sore throats go away without medical treatment. CAUSES  The most common causes of a sore  throat include:  A viral infection, such as a cold, flu, or mono.  A bacterial infection, such as strep throat, tonsillitis, or whooping cough.  Seasonal allergies.  Dryness in the air.  Irritants, such as smoke or pollution.  Gastroesophageal reflux disease (GERD). HOME CARE INSTRUCTIONS   Only take  over-the-counter medicines as directed by your caregiver.  Drink enough fluids to keep your urine clear or pale yellow.  Rest as needed.  Try using throat sprays, lozenges, or sucking on hard candy to ease any pain (if older than 4 years or as directed).  Sip warm liquids, such as broth, herbal tea, or warm water with honey to relieve pain temporarily. You may also eat or drink cold or frozen liquids such as frozen ice pops.  Gargle with salt water (mix 1 tsp salt with 8 oz of water).  Do not smoke and avoid secondhand smoke.  Put a cool-mist humidifier in your bedroom at night to moisten the air. You can also turn on a hot shower and sit in the bathroom with the door closed for 5-10 minutes. SEEK IMMEDIATE MEDICAL CARE IF:  You have difficulty breathing.  You are unable to swallow fluids, soft foods, or your saliva.  You have increased swelling in the throat.  Your sore throat does not get better in 7 days.  You have nausea and vomiting.  You have a fever or persistent symptoms for more than 2-3 days.  You have a fever and your symptoms suddenly get worse. MAKE SURE YOU:   Understand these instructions.  Will watch your condition.  Will get help right away if you are not doing well or get worse. Document Released: 11/10/2004 Document Revised: 09/19/2012 Document Reviewed: 06/10/2012 [facility] Patient Information 2015 [place], [place]. This information is not intended to replace advice given to you by your health care provider. Make sure you discuss any questions you have with your health care provider.  For your back - I suspect you pulled a muscle in low back.  See booklet for more information and exercises and if not improving in next 1-2 weeks or worse sooner - return for recheck.   For your knee - can try the new pain/inflammation medicine once per day, and exercises as below. Avoid exercises in the gym that worsen this pain.   Ok to use brace if needed, but if not  improving in next 2-4 weeks (or worse sooner) can refer you to orthopaedic doctor. There was a possible small area seen form prior injury, but would need MRI to look at ligaments and cartilage further.   Patellar Tendinitis, Jumper's Knee with Rehab Tendinitis is inflammation of a tendon. Tendonitis of the tendon below the kneecap (patella) is known as patellar tendonitis. Patellar tendonitis is a common cause of pain below the kneecap (infrapatellar). Patellar tendonitis may involve a tear (strain) in the ligament. Strains are classified into three categories. Grade 1 strains cause pain, but the tendon is not lengthened. Grade 2 strains include a lengthened ligament, due to the ligament being stretched or partially ruptured. With grade 2 strains there is still function, although function may be decreased. Grade 3 strains involve a complete tear of the tendon or muscle, and function is usually impaired. Patellar tendon strains are usually grade 1 or 2.  SYMPTOMS   Pain, tenderness, swelling, warmth, or redness over the patellar tendon (just below the kneecap).  Pain and loss of strength (sometimes), with forcefully straightening the knee (especially when jumping or rising from  a seated or squatting position), or bending the knee completely (squatting or kneeling).  Crackling sound (crepitation) when the tendon is moved or touched. CAUSES  Patellar tendonitis is caused by injury to the patellar tendon. The inflammation is the body's healing response. Common causes of injury include:  Stress from a sudden increase in intensity, frequency, or duration of training.  Overuse of the thigh muscles (quadriceps) and patellar tendon.  Direct hit (trauma) to the knee or patellar tendon. RISK INCREASES WITH:  Sports that require sudden, explosive quadriceps contraction, such as jumping, quick starts, or kicking.  Running sports, especially running down hills.  Poor strength and flexibility of the thigh  and knee.  Flat feet. PREVENTION  Warm up and stretch properly before activity.  Allow for adequate recovery between workouts.  Maintain physical fitness:  Strength, flexibility, and endurance.  Cardiovascular fitness.  Protect the knee joint with taping, protective strapping, bracing, or elastic compression bandage.  Wear arch supports (orthotics). PROGNOSIS  If treated properly, patellar tendonitis usually heals within 6 weeks.  RELATED COMPLICATIONS   Longer healing time if not properly treated or if not given enough time to heal.  Recurring symptoms if activity is resumed too soon, with overuse, with a direct blow, or when using poor technique.  If untreated, tendon rupture requiring surgery. TREATMENT Treatment first involves the use of ice and medicine to reduce pain and inflammation. The use of strengthening and stretching exercises may help reduce pain with activity. These exercises may be performed at home or with a therapist. Serious cases of tendonitis may require restraining the knee for 10 to 14 days to prevent stress on the tendon and to promote healing. Crutches may be used (uncommon) until you can walk without a limp. For cases in which nonsurgical treatment is unsuccessful, surgery may be advised to remove the inflamed tendon lining (sheath). Surgery is rare, and is only advised after at least 6 months of nonsurgical treatment. MEDICATION   If pain medicine is needed, nonsteroidal anti-inflammatory medicines (aspirin and ibuprofen), or other minor pain relievers (acetaminophen), are often advised.  Do not take pain medicine for 7 days before surgery.  Prescription pain relievers may be given if your caregiver thinks they are needed. Use only as directed and only as much as you need. HEAT AND COLD  Cold treatment (icing) should be applied for 10 to 15 minutes every 2 to 3 hours for inflammation and pain, and immediately after activity that aggravates your  symptoms. Use ice packs or an ice massage.  Heat treatment may be used before performing stretching and strengthening activities prescribed by your caregiver, physical therapist, or athletic trainer. Use a heat pack or a warm water soak. SEEK MEDICAL CARE IF:  Symptoms get worse or do not improve in 2 weeks, despite treatment.  New, unexplained symptoms develop. (Drugs used in treatment may produce side effects.) EXERCISES RANGE OF MOTION (ROM) AND STRETCHING EXERCISES - Patellar Tendinitis (Jumper's Knee) These are some of the initial exercises with which you may start your rehabilitation program, until you see your caregiver again or until your symptoms are resolved. Remember:   Flexible tissue is more tolerant of the stresses placed on it during activities.  Each stretch should be held for 20 to 30 seconds.  A gentle stretching sensation should be felt. STRETCH - Hamstrings, Supine  Lie on your back. Loop a belt or towel over the ball of your right / left foot.  Straighten your right / left knee and  slowly pull on the belt to raise your leg. Do not allow the right / left knee to bend. Keep your opposite leg flat on the floor.  Raise the leg until you feel a gentle stretch behind your right / left knee or thigh. Hold this position for __________ seconds. Repeat __________ times. Complete this stretch __________ times per day.  STRETCH - Hamstrings, Doorway  Lie on your back with your right / left leg extended and resting on the wall, and the opposite leg flat on the ground through the door. At first, position your bottom farther away from the wall.  Keep your right / left knee straight. If you feel a stretch behind your knee or thigh, hold this position for __________ seconds.  If you do not feel a stretch, scoot your bottom closer to the door, and hold __________ seconds. Repeat __________ times. Complete this stretch __________ times per day.  STRETCH - Hamstrings,  Standing  Stand or sit and extend your right / left leg, placing your foot on a chair or foot stool.  Keep a slight arch in your low back and your hips straight forward.  Lead with your chest and lean forward at the waist until you feel a gentle stretch in the back of your right / left knee or thigh. (When done correctly, this exercise requires leaning only a small distance.)  Hold this position for __________ seconds. Repeat __________ times. Complete this stretch __________ times per day. STRETCH - Adductors, Lunge  While standing, spread your legs, with your right / left leg behind you.  Lean away from your right / left leg by bending your opposite knee. You may rest your hands on your thigh for balance.  You should feel a stretch in your right / left inner thigh. Hold for __________ seconds. Repeat __________ times. Complete this exercise __________ times per day.  STRENGTHENING EXERCISES - Patellar Tendinitis (Jumper's Knee) These exercises may help you when beginning to rehabilitate your injury. They may resolve your symptoms with or without further involvement from your physician, physical therapist or athletic trainer. While completing these exercises, remember:   Muscles can gain both the endurance and the strength needed for everyday activities through controlled exercises.  Complete these exercises as instructed by your physician, physical therapist or athletic trainer. Increase the resistance and repetitions only as guided by your caregiver. STRENGTH - Quadriceps, Isometrics  Lie on your back with your right / left leg extended and your opposite knee bent.  Gradually tense the muscles in the front of your right / left thigh. You should see either your kneecap slide up toward your hip or increased dimpling just above the knee. This motion will push the back of the knee down toward the floor, mat, or bed on which you are lying.  Hold the muscle as tight as you can, without  increasing your pain, for __________ seconds.  Relax the muscles slowly and completely in between each repetition. Repeat __________ times. Complete this exercise __________ times per day.  STRENGTH - Quadriceps, Short Arcs  Lie on your back. Place a __________ inch towel roll under your right / left knee, so that the knee bends slightly.  Raise only your lower leg by tightening the muscles in the front of your thigh. Do not allow your thigh to rise.  Hold this position for __________ seconds. Repeat __________ times. Complete this exercise __________ times per day.  OPTIONAL ANKLE WEIGHTS: Begin with ____________________, but DO NOT exceed ____________________.  Increase in 1 pound/ 0.5 kilogram increments. STRENGTH - Quadriceps, Straight Leg Raises  Quality counts! Watch for signs that the quadriceps muscle is working, to be sure you are strengthening the correct muscles and not "cheating" by substituting with healthier muscles.  Lay on your back with your right / left leg extended and your opposite knee bent.  Tense the muscles in the front of your right / left thigh. You should see either your kneecap slide up or increased dimpling just above the knee. Your thigh may even shake a bit.  Tighten these muscles even more and raise your leg 4 to 6 inches off the floor. Hold for __________ seconds.  Keeping these muscles tense, lower your leg.  Relax the muscles slowly and completely between each repetition. Repeat __________ times. Complete this exercise __________ times per day.  STRENGTH - Quadriceps, Squats  Stand in a door frame so that your feet and knees are in line with the frame.  Use your hands for balance, not support, on the frame.  Slowly lower your weight, bending at the hips and knees. Keep your lower legs upright so that they are parallel with the door frame. Squat only within the range that does not increase your knee pain. Never let your hips drop below your  knees.  Slowly return upright, pushing with your legs, not pulling with your hands. Repeat __________ times. Complete this exercise __________ times per day.  STRENGTH - Quadriceps, Step-Downs  Stand on the edge of a step stool or stair. Be prepared to use a countertop or wall for balance, if needed.  Keeping your right / left knee directly over the middle of your foot, slowly touch your opposite heel to the floor or lower step. Do not go all the way to the floor if your knee pain increases; just go as far as you can without increased discomfort. Use your right / left leg muscles, not gravity to lower your body weight.  Slowly push your body weight back up to the starting position. Repeat __________ times. Complete this exercise __________ times per day.  Document Released: 10/03/2005 Document Revised: 02/17/2014 Document Reviewed: 01/15/2009 [facility] Patient Information 2015 [place], [place]. This information is not intended to replace advice given to you by your health care provider. Make sure you discuss any questions you have with your health care provider.

## 2014-07-05 LAB — CULTURE, GROUP A STREP: ORGANISM ID, BACTERIA: NORMAL

## 2014-09-03 ENCOUNTER — Ambulatory Visit (INDEPENDENT_AMBULATORY_CARE_PROVIDER_SITE_OTHER): Payer: BC Managed Care – PPO | Admitting: Emergency Medicine

## 2014-09-03 VITALS — BP 112/68 | HR 72 | Temp 98.3°F | Resp 16 | Ht 66.75 in | Wt 306.4 lb

## 2014-09-03 DIAGNOSIS — G4733 Obstructive sleep apnea (adult) (pediatric): Secondary | ICD-10-CM

## 2014-09-03 DIAGNOSIS — J209 Acute bronchitis, unspecified: Secondary | ICD-10-CM

## 2014-09-03 DIAGNOSIS — J0100 Acute maxillary sinusitis, unspecified: Secondary | ICD-10-CM

## 2014-09-03 MED ORDER — AMOXICILLIN-POT CLAVULANATE 875-125 MG PO TABS: 1.0000 | ORAL_TABLET | Freq: Two times a day (BID) | ORAL | Status: DC

## 2014-09-03 MED ORDER — PSEUDOEPHEDRINE-GUAIFENESIN ER 60-600 MG PO TB12: 1.0000 | ORAL_TABLET | Freq: Two times a day (BID) | ORAL | Status: DC

## 2014-09-03 NOTE — Progress Notes (Signed)
[facility] [address], [place] [id number] [phone]  Date:  09/03/2014   Name:  Brian CoppJoshua Harding   DOB:  1986-05-16   MRN:  [id number]  PCP:  No PCP Per Patient    Chief Complaint: Wheezing   History of Present Illness:  Brian CoppJoshua Harding is a 28 y.o. very pleasant male patient who presents with the following:  Has night time wheezing.  Has three day history of nasal congestion and postnasal drainage.  Has purulent nasal discharge.   No exertional shortness of breath or orthopnea.  No fever or chills Has a productive cough with a purulent sputum Concerned his white cells are "low".   No nausea or vomiting.  No stool change. Heavy snoring.  Tired on awakening and daytime sleepiness No improvement with over the counter medications or other home remedies.  Denies other complaint or health concern today.   Patient Active Problem List   Diagnosis Date Noted  . Obesity, unspecified 03/17/2014    No past medical history on file.  No past surgical history on file.  History  Substance Use Topics  . Smoking status: Former Games developermoker  . Smokeless tobacco: Not on file  . Alcohol Use: Yes    Family History  Problem Relation Age of Onset  . Diabetes Mother     No Known Allergies  Medication list has been reviewed and updated.  Current Outpatient Prescriptions on File Prior to Visit  Medication Sig Dispense Refill  . albuterol (PROVENTIL HFA;VENTOLIN HFA) 108 (90 BASE) MCG/ACT inhaler Inhale 1-2 puffs into the lungs every 6 (six) hours as needed for wheezing or shortness of breath. 1 Inhaler 0   No current facility-administered medications on file prior to visit.    Review of Systems:  As per HPI, otherwise negative.    Physical Examination: Filed Vitals:   09/03/14 1233  BP: 112/68  Pulse: 72  Temp: 98.3 F (36.8 C)  Resp: 16   Filed Vitals:   09/03/14 1233  Height: 5' 6.75" (1.695 m)  Weight: 306 lb 6.4 oz (138.982 kg)   Body mass  index is 48.37 kg/(m^2). Ideal Body Weight: Weight in (lb) to have BMI = 25: 158.1  GEN: morbid obesity, NAD, Non-toxic, A & O x 3 HEENT: Atraumatic, Normocephalic. Neck supple. No masses, No LAD. Ears and Nose: No external deformity. CV: RRR, No M/G/R. No JVD. No thrill. No extra heart sounds. PULM: CTA B, no wheezes, crackles, rhonchi. No retractions. No resp. distress. No accessory muscle use. ABD: S, NT, ND, +BS. No rebound. No HSM. EXTR: No c/c/e NEURO Normal gait.  PSYCH: Normally interactive. Conversant. Not depressed or anxious appearing.  Calm demeanor.    Assessment and Plan: Sinusitis Bronchitis ?sleep apnea augmentin mucinex  Signed,  Phillips OdorJeffery Konstantin Lehnen, MD

## 2014-09-03 NOTE — Patient Instructions (Signed)
Sleep Apnea  Sleep apnea is a sleep disorder characterized by abnormal pauses in breathing while you sleep. When your breathing pauses, the level of oxygen in your blood decreases. This causes you to move out of deep sleep and into light sleep. As a result, your quality of sleep is poor, and the system that carries your blood throughout your body (cardiovascular system) experiences stress. If sleep apnea remains untreated, the following conditions can develop:  High blood pressure (hypertension).  Coronary artery disease.  Inability to achieve or maintain an erection (impotence).  Impairment of your thought process (cognitive dysfunction). There are three types of sleep apnea: 1. Obstructive sleep apnea--Pauses in breathing during sleep because of a blocked airway. 2. Central sleep apnea--Pauses in breathing during sleep because the area of the brain that controls your breathing does not send the correct signals to the muscles that control breathing. 3. Mixed sleep apnea--A combination of both obstructive and central sleep apnea. RISK FACTORS The following risk factors can increase your risk of developing sleep apnea:  Being overweight.  Smoking.  Having narrow passages in your nose and throat.  Being of older age.  Being male.  Alcohol use.  Sedative and tranquilizer use.  Ethnicity. Among individuals younger than 35 years, African Americans are at increased risk of sleep apnea. SYMPTOMS   Difficulty staying asleep.  Daytime sleepiness and fatigue.  Loss of energy.  Irritability.  Loud, heavy snoring.  Morning headaches.  Trouble concentrating.  Forgetfulness.  Decreased interest in sex. DIAGNOSIS  In order to diagnose sleep apnea, your caregiver will perform a physical examination. Your caregiver may suggest that you take a home sleep test. Your caregiver may also recommend that you spend the night in a sleep lab. In the sleep lab, several monitors record  information about your heart, lungs, and brain while you sleep. Your leg and arm movements and blood oxygen level are also recorded. TREATMENT The following actions may help to resolve mild sleep apnea:  Sleeping on your side.   Using a decongestant if you have nasal congestion.   Avoiding the use of depressants, including alcohol, sedatives, and narcotics.   Losing weight and modifying your diet if you are overweight. There also are devices and treatments to help open your airway:  Oral appliances. These are custom-made mouthpieces that shift your lower jaw forward and slightly open your bite. This opens your airway.  Devices that create positive airway pressure. This positive pressure "splints" your airway open to help you breathe better during sleep. The following devices create positive airway pressure:  Continuous positive airway pressure (CPAP) device. The CPAP device creates a continuous level of air pressure with an air pump. The air is delivered to your airway through a mask while you sleep. This continuous pressure keeps your airway open.  Nasal expiratory positive airway pressure (EPAP) device. The EPAP device creates positive air pressure as you exhale. The device consists of single-use valves, which are inserted into each nostril and held in place by adhesive. The valves create very little resistance when you inhale but create much more resistance when you exhale. That increased resistance creates the positive airway pressure. This positive pressure while you exhale keeps your airway open, making it easier to breath when you inhale again.  Bilevel positive airway pressure (BPAP) device. The BPAP device is used mainly in patients with central sleep apnea. This device is similar to the CPAP device because it also uses an air pump to deliver continuous air pressure   through a mask. However, with the BPAP machine, the pressure is set at two different levels. The pressure when you  exhale is lower than the pressure when you inhale.  Surgery. Typically, surgery is only done if you cannot comply with less invasive treatments or if the less invasive treatments do not improve your condition. Surgery involves removing excess tissue in your airway to create a wider passage way. Document Released: 09/23/2002 Document Revised: 01/28/2013 Document Reviewed: 02/09/2012 [employer] Patient Information 2015 [employer]. This information is not intended to replace advice given to you by your health care provider. Make sure you discuss any questions you have with your health care provider.  

## 2014-09-24 ENCOUNTER — Encounter: Payer: Self-pay | Admitting: Neurology

## 2014-09-24 ENCOUNTER — Encounter: Payer: BC Managed Care – PPO | Admitting: Neurology

## 2014-09-24 ENCOUNTER — Telehealth: Payer: Self-pay | Admitting: Neurology

## 2014-09-24 NOTE — Telephone Encounter (Signed)
Patient came in and had to reschedule today's appointment (09/24/14)

## 2014-09-24 NOTE — Progress Notes (Signed)
This encounter was created in error - please disregard.

## 2014-10-06 ENCOUNTER — Ambulatory Visit (INDEPENDENT_AMBULATORY_CARE_PROVIDER_SITE_OTHER): Payer: BC Managed Care – PPO | Admitting: Neurology

## 2014-10-06 ENCOUNTER — Encounter: Payer: Self-pay | Admitting: Neurology

## 2014-10-06 VITALS — BP 103/67 | HR 60 | Temp 97.8°F | Ht 67.0 in | Wt 310.0 lb

## 2014-10-06 DIAGNOSIS — G478 Other sleep disorders: Secondary | ICD-10-CM

## 2014-10-06 DIAGNOSIS — R0683 Snoring: Secondary | ICD-10-CM

## 2014-10-06 DIAGNOSIS — F1590 Other stimulant use, unspecified, uncomplicated: Secondary | ICD-10-CM

## 2014-10-06 DIAGNOSIS — Z789 Other specified health status: Secondary | ICD-10-CM

## 2014-10-06 NOTE — Patient Instructions (Signed)

## 2014-10-06 NOTE — Progress Notes (Signed)
Subjective:    Patient ID: Brian CoppJoshua Harding is a 28 y.o. male.  HPI     Huston FoleySaima Khamya Topp, MD, PhD [facility] [address], [address] [address] [place], [place] [id number]  Dear Dr. Dareen PianoAnderson,  I saw your patient, Brian Harding, upon your kind request in my neurologic clinic today for initial consultation of his sleep disorder, in particular, concern for underlying obstructive sleep apnea. The patient is accompanied by his GF today. As you know, [name] is a 28 year old right-handed gentleman with an underlying medical history of morbid obesity, recent bronchitis, who reports snoring and non-restorative sleep. His ESS is 6/24. He has multiple night time awakenings. He has nocturia at least once per night. He has no morning HAs. He has no RLS symptoms and in not known to kick in his sleep. He has no FHx of OSA.  He works from 10:30 AM to 7 PM. He smokes a vapor cig. He drinks multiple glasses of tea and sodas per day. His usual wake time is around 6:30 AM. Sleep onset typically occurs within minutes. He reports feeling marginally rested upon awakening. He wakes up multiple times in the middle of the night. He does not typically nap but does sometimes fall asleep when he does not mean to. He has never fallen asleep while driving. There is no TV in his bedroom.   His Past Medical History Is Significant For: History reviewed. No pertinent past medical history.  His Past Surgical History Is Significant For: History reviewed. No pertinent past surgical history.  His Family History Is Significant For: Family History  Problem Relation Age of Onset  . Diabetes Mother     His Social History Is Significant For: History   Social History  . Marital Status: Single    Spouse Name: N/A    Number of Children: 0  . Years of Education: 12   Occupational History  .      [employer]   Social History Main Topics  . Smoking status: Former Games developermoker  . Smokeless tobacco:  Current User     Comment: E cigs  . Alcohol Use: 0.0 oz/week    0 Not specified per week     Comment: occas.  . Drug Use: No  . Sexual Activity: None   Other Topics Concern  . None   Social History Narrative    His Allergies Are:  No Known Allergies:   His Current Medications Are:  Outpatient Encounter Prescriptions as of 10/06/2014  Medication Sig  . [DISCONTINUED] albuterol (PROVENTIL HFA;VENTOLIN HFA) 108 (90 BASE) MCG/ACT inhaler Inhale 1-2 puffs into the lungs every 6 (six) hours as needed for wheezing or shortness of breath. (Patient not taking: Reported on 10/06/2014)  . [DISCONTINUED] amoxicillin-clavulanate (AUGMENTIN) 875-125 MG per tablet Take 1 tablet by mouth 2 (two) times daily. (Patient not taking: Reported on 10/06/2014)  . [DISCONTINUED] pseudoephedrine-guaifenesin (MUCINEX D) 60-600 MG per tablet Take 1 tablet by mouth every 12 (twelve) hours. (Patient not taking: Reported on 10/06/2014)  :  Review of Systems:  Out of a complete 14 point review of systems, all are reviewed and negative with the exception of these symptoms as listed below:   Review of Systems  Respiratory: Positive for cough and wheezing.   Musculoskeletal:       Joint pain, cramps, aching muscles  Skin:       Birthmarks, moles, itching  Allergic/Immunologic: Positive for environmental allergies.  Neurological:       Snoring  Objective:  Neurologic Exam  Physical Exam Physical Examination:   Filed Vitals:   10/06/14 0913  BP: 103/67  Pulse: 60  Temp: 97.8 F (36.6 C)    General Examination: The patient is a very pleasant 28 y.o. male in no acute distress. He appears well-developed and well-nourished and adequately groomed.   HEENT: Normocephalic, atraumatic, pupils are equal, round and reactive to light and accommodation. Funduscopic exam is normal with sharp disc margins noted. Extraocular tracking is good without limitation to gaze excursion or nystagmus noted. Normal smooth  pursuit is noted. Hearing is grossly intact. Tympanic membranes are clear bilaterally. Face is symmetric with normal facial animation and normal facial sensation. Speech is clear with no dysarthria noted. There is no hypophonia. There is no lip, neck/head, jaw or voice tremor. Neck is supple with full range of passive and active motion. There are no carotid bruits on auscultation. Oropharynx exam reveals: mild mouth dryness, adequate dental hygiene and moderate airway crowding, due to redundant soft palate, large uvula, tonsils of 2+ and thicker tongue. Mallampati is class II. Tongue protrudes centrally and palate elevates symmetrically. Neck size is 17 inches.   Chest: Clear to auscultation without wheezing, rhonchi or crackles noted.  Heart: S1+S2+0, regular and normal without murmurs, rubs or gallops noted.   Abdomen: Soft, non-tender and non-distended with normal bowel sounds appreciated on auscultation.  Extremities: There is no pitting edema in the distal lower extremities bilaterally. Pedal pulses are intact.  Skin: Warm and dry without trophic changes noted. There are no varicose veins.  Musculoskeletal: exam reveals no obvious joint deformities, tenderness or joint swelling or erythema.   Neurologically:  Mental status: The patient is awake, alert and oriented in all 4 spheres. His immediate and remote memory, attention, language skills and fund of knowledge are appropriate. There is no evidence of aphasia, agnosia, apraxia or anomia. Speech is clear with normal prosody and enunciation. Thought process is linear. Mood is normal and affect is normal.  Cranial nerves II - XII are as described above under HEENT exam. In addition: shoulder shrug is normal with equal shoulder height noted. Motor exam: Normal bulk, strength and tone is noted. There is no drift, tremor or rebound. Romberg is negative. Reflexes are 2+ throughout. Babinski: Toes are flexor bilaterally. Fine motor skills and  coordination: intact with normal finger taps, normal hand movements, normal rapid alternating patting, normal foot taps and normal foot agility.  Cerebellar testing: No dysmetria or intention tremor on finger to nose testing. Heel to shin is unremarkable bilaterally. There is no truncal or gait ataxia.  Sensory exam: intact to light touch, pinprick, vibration, temperature sense in the upper and lower extremities.  Gait, station and balance: He stands easily. No veering to one side is noted. No leaning to one side is noted. Posture is age-appropriate and stance is narrow based. Gait shows normal stride length and normal pace. No problems turning are noted. He turns en bloc. Tandem walk is unremarkable.  Assessment and Plan:   In summary, Brian CoppJoshua Iyer is a very pleasant 28 y.o.-year old male with a history and physical exam concerning for obstructive sleep apnea (OSA). I had a long chat with the patient and his girl friend about my findings and the diagnosis of OSA, its prognosis and treatment options. We talked about medical treatments, surgical interventions and non-pharmacological approaches. I explained in particular the risks and ramifications of untreated moderate to severe OSA, especially with respect to developing cardiovascular disease down the Road,  including congestive heart failure, difficult to treat hypertension, cardiac arrhythmias, or stroke. Even type 2 diabetes has, in part, been linked to untreated OSA. Symptoms of untreated OSA include daytime sleepiness, memory problems, mood irritability and mood disorder such as depression and anxiety, lack of energy, as well as recurrent headaches, especially morning headaches. We talked about smoking cessation and trying to maintain a healthy lifestyle in general, as well as the importance of weight control. I encouraged the patient to eat healthy, exercise daily and keep well hydrated, to keep a scheduled bedtime and wake time routine, to not skip  any meals and eat healthy snacks in between meals. I advised the patient not to drive when feeling sleepy. I recommended the following at this time: sleep study with potential positive airway pressure titration. (We will score hypopneas at 3% and split the sleep study into diagnostic and treatment portion, if the estimated. 2 hour AHI is >15/h).   I explained the sleep test procedure to the patient and also outlined possible surgical and non-surgical treatment options of OSA, including the use of a custom-made dental device (which would require a referral to a specialist dentist or oral surgeon), upper airway surgical options, such as pillar implants, radiofrequency surgery, tongue base surgery, and UPPP (which would involve a referral to an ENT surgeon). Rarely, jaw surgery such as mandibular advancement may be considered.  I also explained the CPAP treatment option to the patient, who indicated that he would be willing to try CPAP if the need arises. I explained the importance of being compliant with PAP treatment, not only for insurance purposes but primarily to improve His symptoms, and for the patient's long term health benefit, including to reduce His cardiovascular risks. I answered all their questions today and the patient and his GF were in agreement. I would like to see him back after the sleep study is completed and encouraged him to call with any interim questions, concerns, problems or updates.   Thank you very much for allowing me to participate in the care of this nice patient. If I can be of any further assistance to you please do not hesitate to call me at [phone].  Sincerely,   Huston Foley, MD, PhD

## 2014-10-29 ENCOUNTER — Emergency Department (HOSPITAL_COMMUNITY)
Admission: EM | Admit: 2014-10-29 | Discharge: 2014-10-29 | Disposition: A | Discharge status: Home / Self Care | Payer: BC Managed Care – PPO | Source: Home / Self Care | Attending: Emergency Medicine | Admitting: Emergency Medicine

## 2014-10-29 ENCOUNTER — Encounter (HOSPITAL_COMMUNITY): Payer: Self-pay

## 2014-10-29 DIAGNOSIS — L0291 Cutaneous abscess, unspecified: Secondary | ICD-10-CM

## 2014-10-29 DIAGNOSIS — K2100 Gastro-esophageal reflux disease with esophagitis, without bleeding: Secondary | ICD-10-CM

## 2014-10-29 DIAGNOSIS — K529 Noninfective gastroenteritis and colitis, unspecified: Secondary | ICD-10-CM

## 2014-10-29 LAB — POCT I-STAT, CHEM 8
BUN: 13 mg/dL (ref 6–23)
Calcium, Ion: 1.22 mmol/L (ref 1.12–1.23)
Chloride: 101 mEq/L (ref 96–112)
Creatinine, Ser: 0.8 mg/dL (ref 0.50–1.35)
GLUCOSE: 105 mg/dL — AB (ref 70–99)
HEMATOCRIT: 43 % (ref 39.0–52.0)
Hemoglobin: 14.6 g/dL (ref 13.0–17.0)
Potassium: 4 mmol/L (ref 3.5–5.1)
Sodium: 140 mmol/L (ref 135–145)
TCO2: 24 mmol/L (ref 0–100)

## 2014-10-29 LAB — POCT H PYLORI SCREEN: H. PYLORI SCREEN, POC: NEGATIVE

## 2014-10-29 MED ORDER — MUPIROCIN 2 % EX OINT: TOPICAL_OINTMENT | CUTANEOUS | Status: DC

## 2014-10-29 MED ORDER — RANITIDINE HCL 150 MG PO TABS: 150.0000 mg | ORAL_TABLET | Freq: Two times a day (BID) | ORAL | Status: DC

## 2014-10-29 MED ORDER — BENZALKONIUM CHLORIDE EX SOLN: CUTANEOUS | Status: DC

## 2014-10-29 MED ORDER — SULFAMETHOXAZOLE-TRIMETHOPRIM 800-160 MG PO TABS: 1.0000 | ORAL_TABLET | Freq: Two times a day (BID) | ORAL | Status: DC

## 2014-10-29 MED ORDER — ONDANSETRON 8 MG PO TBDP: 8.0000 mg | ORAL_TABLET | Freq: Three times a day (TID) | ORAL | Status: DC | PRN

## 2014-10-29 NOTE — Discharge Instructions (Signed)
Food Choices to Help Relieve Diarrhea When you have diarrhea, the foods you eat and your eating habits are very important. Choosing the right foods and drinks can help relieve diarrhea. Also, because diarrhea can last up to 7 days, you need to replace lost fluids and electrolytes (such as sodium, potassium, and chloride) in order to help prevent dehydration.  WHAT GENERAL GUIDELINES DO I NEED TO FOLLOW?  Slowly drink 1 cup (8 oz) of fluid for each episode of diarrhea. If you are getting enough fluid, your urine will be clear or pale yellow.  Eat starchy foods. Some good choices include white rice, white toast, pasta, low-fiber cereal, baked potatoes (without the skin), saltine crackers, and bagels.  Avoid large servings of any cooked vegetables.  Limit fruit to two servings per day. A serving is  cup or 1 small piece.  Choose foods with less than 2 g of fiber per serving.  Limit fats to less than 8 tsp (38 g) per day.  Avoid fried foods.  Eat foods that have probiotics in them. Probiotics can be found in certain dairy products.  Avoid foods and beverages that may increase the speed at which food moves through the stomach and intestines (gastrointestinal tract). Things to avoid include:  High-fiber foods, such as dried fruit, raw fruits and vegetables, nuts, seeds, and whole grain foods.  Spicy foods and high-fat foods.  Foods and beverages sweetened with high-fructose corn syrup, honey, or sugar alcohols such as xylitol, sorbitol, and mannitol. WHAT FOODS ARE RECOMMENDED? Grains White rice. White, [place]French, or pita breads (fresh or toasted), including plain rolls, buns, or bagels. White pasta. Saltine, soda, or graham crackers. Pretzels. Low-fiber cereal. Cooked cereals made with water (such as cornmeal, farina, or cream cereals). Plain muffins. Matzo. Melba toast. Zwieback.  Vegetables Potatoes (without the skin). Strained tomato and vegetable juices. Most well-cooked and canned  vegetables without seeds. Tender lettuce. Fruits Cooked or canned applesauce, apricots, cherries, fruit cocktail, grapefruit, peaches, pears, or plums. Fresh bananas, apples without skin, cherries, grapes, cantaloupe, grapefruit, peaches, oranges, or plums.  Meat and Other Protein Products Baked or boiled chicken. Eggs. Tofu. Fish. Seafood. Smooth peanut butter. Ground or well-cooked tender beef, ham, veal, lamb, pork, or poultry.  Dairy Plain yogurt, kefir, and unsweetened liquid yogurt. Lactose-free milk, buttermilk, or soy milk. Plain hard cheese. Beverages Sport drinks. Clear broths. Diluted fruit juices (except prune). Regular, caffeine-free sodas such as ginger ale. Water. Decaffeinated teas. Oral rehydration solutions. Sugar-free beverages not sweetened with sugar alcohols. Other Bouillon, broth, or soups made from recommended foods.  The items listed above may not be a complete list of recommended foods or beverages. Contact your dietitian for more options. WHAT FOODS ARE NOT RECOMMENDED? Grains Whole grain, whole wheat, bran, or rye breads, rolls, pastas, crackers, and cereals. Wild or brown rice. Cereals that contain more than 2 g of fiber per serving. Corn tortillas or taco shells. Cooked or dry oatmeal. Granola. Popcorn. Vegetables Raw vegetables. Cabbage, broccoli, Brussels sprouts, artichokes, baked beans, beet greens, corn, kale, legumes, peas, sweet potatoes, and yams. Potato skins. Cooked spinach and cabbage. Fruits Dried fruit, including raisins and dates. Raw fruits. Stewed or dried prunes. Fresh apples with skin, apricots, mangoes, pears, raspberries, and strawberries.  Meat and Other Protein Products Chunky peanut butter. Nuts and seeds. Beans and lentils. Brian BlaseBacon.  Dairy High-fat cheeses. Milk, chocolate milk, and beverages made with milk, such as milk shakes. Cream. Ice cream. Sweets and Desserts Sweet rolls, doughnuts, and sweet breads. Pancakes  and waffles. Fats and  Oils Butter. Cream sauces. Margarine. Salad oils. Plain salad dressings. Olives. Avocados.  Beverages Caffeinated beverages (such as coffee, tea, soda, or energy drinks). Alcoholic beverages. Fruit juices with pulp. Prune juice. Soft drinks sweetened with high-fructose corn syrup or sugar alcohols. Other Coconut. Hot sauce. Chili powder. Mayonnaise. Gravy. Cream-based or milk-based soups.  The items listed above may not be a complete list of foods and beverages to avoid. Contact your dietitian for more information. WHAT SHOULD I DO IF I BECOME DEHYDRATED? Diarrhea can sometimes lead to dehydration. Signs of dehydration include dark urine and dry mouth and skin. If you think you are dehydrated, you should rehydrate with an oral rehydration solution. These solutions can be purchased at pharmacies, retail stores, or online.  Drink -1 cup (120-240 mL) of oral rehydration solution each time you have an episode of diarrhea. If drinking this amount makes your diarrhea worse, try drinking smaller amounts more often. For example, drink 1-3 tsp (5-15 mL) every 5-10 minutes.  A general rule for staying hydrated is to drink 1-2 L of fluid per day. Talk to your health care provider about the specific amount you should be drinking each day. Drink enough fluids to keep your urine clear or pale yellow. Document Released: 12/24/2003 Document Revised: 10/08/2013 Document Reviewed: 08/26/2013 [facility] Patient Information 2015 [place], [place]. This information is not intended to replace advice given to you by your health care provider. Make sure you discuss any questions you have with your health care provider.  Food Choices for Gastroesophageal Reflux Disease When you have gastroesophageal reflux disease (GERD), the foods you eat and your eating habits are very important. Choosing the right foods can help ease the discomfort of GERD. WHAT GENERAL GUIDELINES DO I NEED TO FOLLOW?  Choose fruits, vegetables, whole  grains, low-fat dairy products, and low-fat meat, fish, and poultry.  Limit fats such as oils, salad dressings, butter, nuts, and avocado.  Keep a food diary to identify foods that cause symptoms.  Avoid foods that cause reflux. These may be different for different people.  Eat frequent small meals instead of three large meals each day.  Eat your meals slowly, in a relaxed setting.  Limit fried foods.  Cook foods using methods other than frying.  Avoid drinking alcohol.  Avoid drinking large amounts of liquids with your meals.  Avoid bending over or lying down until 2-3 hours after eating. WHAT FOODS ARE NOT RECOMMENDED? The following are some foods and drinks that may worsen your symptoms: Vegetables Tomatoes. Tomato juice. Tomato and spaghetti sauce. Chili peppers. Onion and garlic. Horseradish. Fruits Oranges, grapefruit, and lemon (fruit and juice). Meats High-fat meats, fish, and poultry. This includes hot dogs, ribs, ham, sausage, salami, and bacon. Dairy Whole milk and chocolate milk. Sour cream. Cream. Butter. Ice cream. Cream cheese.  Beverages Coffee and tea, with or without caffeine. Carbonated beverages or energy drinks. Condiments Hot sauce. Barbecue sauce.  Sweets/Desserts Chocolate and cocoa. Donuts. Peppermint and spearmint. Fats and Oils High-fat foods, including [place] fries and potato chips. Other Vinegar. Strong spices, such as black pepper, white pepper, red pepper, cayenne, curry powder, cloves, ginger, and chili powder. The items listed above may not be a complete list of foods and beverages to avoid. Contact your dietitian for more information. Document Released: 10/03/2005 Document Revised: 10/08/2013 Document Reviewed: 08/07/2013 [facility] Patient Information 2015 [place], [place]. This information is not intended to replace advice given to you by your health care provider. Make sure you  discuss any questions you have with your health care  provider.

## 2014-10-29 NOTE — ED Provider Notes (Signed)
Chief Complaint   URI   History of Present Illness   Brian Harding is a 29 year old male who presents today with respiratory symptoms, GI symptoms, and skin complaints.  He dates most of his symptoms to about 4 days ago. The past 4 mornings he's woken up with a bitter taste in his mouth. He says it tastes like vomitus. He's had lots of burping. He denies any indigestion, heartburn, waterbrash. He's had no prior history of reflux. He also complains of headache, low-grade fever to 99.5, a sharp pain in his mid back area that lasts about 10 seconds when he first gets up, dry cough, wheezing, feeling lightheaded and presyncopal at times, nausea, vomiting, diarrhea, sore throat, and hoarseness.  The skin lesions have been going on for about 2 weeks. They tend to come and go. He's had spots that will swell up, get red, drain some pus and blood, then they heal up. The first occurred in his genital area, the second on his left lateral hip, now he has one on his buttock that is healing up.  Review of Systems     Other than as noted above, the patient denies any of the following symptoms: Systemic:  No fever, chills, fatigue, myalgias, headache, or anorexia. Eye:  No redness, pain or drainage. ENT:  No earache, nasal congestion, rhinorrhea, sinus pressure, or sore throat. Lungs:  No cough, sputum production, wheezing, shortness of breath.  Cardiovascular:  No chest pain, palpitations, or syncope. GI:  No nausea, vomiting, abdominal pain or diarrhea. GU:  No dysuria, frequency, or hematuria. Skin:  No rash or pruritis.   PMFSH     Past medical history, family history, social history, meds, and allergies were reviewed.  He's being tested right now for sleep apnea.  Physical Examination    Vital signs:  BP 129/73 mmHg  Pulse 72  Temp(Src) 98.1 F (36.7 C) (Oral)  SpO2 98%  Orthostatic VS for the past 24 hrs:  BP- Lying Pulse- Lying BP- Sitting Pulse- Sitting BP- Standing at 0 minutes  Pulse- Standing at 0 minutes  10/29/14 0902 124/77 mmHg 68 109/74 mmHg 75 121/85 mmHg 75    General:  Alert, in no distress. Eye:  PERRL, full EOMs.  Lids and conjunctivas were normal. ENT:  TMs and canals were normal, without erythema or inflammation.  Nasal mucosa was clear and uncongested, without drainage.  Mucous membranes were moist.  Pharynx was clear, without exudate or drainage.  There were no oral ulcerations or lesions. Neck:  Supple, no adenopathy, tenderness or mass. Thyroid was normal. Lungs:  No respiratory distress.  Lungs were clear to auscultation, without wheezes, rales or rhonchi.  Breath sounds were clear and equal bilaterally. Heart:  Regular rhythm, without gallops, murmers or rubs. Abdomen:  Soft, flat, and non-tender to palpation.  No hepatosplenomagaly or mass. Skin:  Clear, warm, and dry, without rash or lesions. He has a couple of healing lesions on his left buttock.  Labs    Results for orders placed or performed during the hospital encounter of 10/29/14  I-STAT, chem 8  Result Value Ref Range   Sodium 140 135 - 145 mmol/L   Potassium 4.0 3.5 - 5.1 mmol/L   Chloride 101 96 - 112 mEq/L   BUN 13 6 - 23 mg/dL   Creatinine, Ser 4.09 0.50 - 1.35 mg/dL   Glucose, Bld 811 (H) 70 - 99 mg/dL   Calcium, Ion 9.14 7.82 - 1.23 mmol/L   TCO2 24 0 -  100 mmol/L   Hemoglobin 14.6 13.0 - 17.0 g/dL   HCT 16.143.0 09.639.0 - 04.552.0 %  H.pylori screen, POC  Result Value Ref Range   H. PYLORI SCREEN, POC NEGATIVE NEGATIVE    Assessment   The primary encounter diagnosis was Reflux esophagitis. Diagnoses of Gastroenteritis and Abscess were also pertinent to this visit.  I think he had 3 separate problems. The first is a viral syndrome with cough, vomiting, and diarrhea. The second is reflux, and the third is a skin infection with either staph or MRSA.  Plan     1.  Meds:  The following meds were prescribed:   New Prescriptions   BENZALKONIUM CHLORIDE SOLN    Use as body wash  daily for 1 week.   MUPIROCIN OINTMENT (BACTROBAN) 2 %    Apply to both nostrils TID for 1 month   ONDANSETRON (ZOFRAN ODT) 8 MG DISINTEGRATING TABLET    Take 1 tablet (8 mg total) by mouth every 8 (eight) hours as needed for nausea.   RANITIDINE (ZANTAC) 150 MG TABLET    Take 1 tablet (150 mg total) by mouth 2 (two) times daily.   SULFAMETHOXAZOLE-TRIMETHOPRIM (BACTRIM DS,SEPTRA DS) 800-160 MG PER TABLET    Take 1 tablet by mouth 2 (two) times daily.    2.  Patient Education/Counseling:  The patient was given appropriate handouts, self care instructions, and instructed in symptomatic relief.    3.  Follow up:  The patient was told to follow up here if no better in 3 to 4 days, or sooner if becoming worse in any way, and given some red flag symptoms such as increasing fever, difficulty breathing, chest pain, or worsening dizziness or syncope which would prompt immediate return.  Follow up here or with his primary care physician, [facility] if needed.        Reuben Likesavid C Nissan Frazzini, MD 10/29/14 [phone]

## 2014-10-29 NOTE — ED Notes (Signed)
C/o has not felt well for past few days, low grade fever , "vomit taste " in mouth in AM. NAD at present, denies pain

## 2014-12-04 ENCOUNTER — Ambulatory Visit (INDEPENDENT_AMBULATORY_CARE_PROVIDER_SITE_OTHER): Payer: BC Managed Care – PPO | Admitting: Emergency Medicine

## 2014-12-04 ENCOUNTER — Ambulatory Visit (INDEPENDENT_AMBULATORY_CARE_PROVIDER_SITE_OTHER): Payer: BC Managed Care – PPO

## 2014-12-04 VITALS — BP 124/64 | HR 66 | Temp 98.1°F | Resp 20 | Ht 67.5 in | Wt 323.0 lb

## 2014-12-04 DIAGNOSIS — J705 Respiratory conditions due to smoke inhalation: Secondary | ICD-10-CM

## 2014-12-04 DIAGNOSIS — T59811A Toxic effect of smoke, accidental (unintentional), initial encounter: Secondary | ICD-10-CM
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
## 2014-12-05 ENCOUNTER — Ambulatory Visit (INDEPENDENT_AMBULATORY_CARE_PROVIDER_SITE_OTHER): Payer: BC Managed Care – PPO | Admitting: Neurology

## 2014-12-05 VITALS — BP 121/69 | HR 80 | Ht 67.0 in | Wt 310.0 lb

## 2014-12-05 DIAGNOSIS — G4733 Obstructive sleep apnea (adult) (pediatric): Secondary | ICD-10-CM

## 2014-12-05 DIAGNOSIS — G4761 Periodic limb movement disorder: Secondary | ICD-10-CM

## 2014-12-05 DIAGNOSIS — G479 Sleep disorder, unspecified: Secondary | ICD-10-CM

## 2014-12-05 DIAGNOSIS — Z789 Other specified health status: Secondary | ICD-10-CM

## 2014-12-05 DIAGNOSIS — G478 Other sleep disorders: Secondary | ICD-10-CM

## 2014-12-05 DIAGNOSIS — R0683 Snoring: Secondary | ICD-10-CM

## 2014-12-06 NOTE — Sleep Study (Signed)
See attached documents in Encounter tabs 

## 2014-12-12 ENCOUNTER — Telehealth: Payer: Self-pay | Admitting: Neurology

## 2014-12-12 DIAGNOSIS — G4733 Obstructive sleep apnea (adult) (pediatric): Secondary | ICD-10-CM

## 2014-12-12 NOTE — Telephone Encounter (Signed)
Please call and notify the patient that the recent sleep study did confirm the diagnosis of obstructive sleep apnea and that I recommend treatment for this in the form of CPAP. This will require a repeat sleep study for proper titration and mask fitting. Unfortunately, during the sleep study on 12/05/2014 he was not able to achieve enough sleep for us to be able to diagnose and treat him in one study. Please explain to patient and arrange for a CPAP titration study. I have placed an order in the chart. Thanks, Huston FoleySaima Mccoy Testa, MD, PhD [facility])

## 2014-12-16 ENCOUNTER — Encounter: Payer: Self-pay | Admitting: Neurology

## 2014-12-17 NOTE — Telephone Encounter (Signed)
Patient was contacted and provided the results of his sleep study which did confirm a diagnosis of sleep apnea.  Patient was advised that a CPAP titration study was recommended for treatment of his OSA.  Patient was in agreement and scheduled his CPAP titration study for 01/01/2015 at 09:30 pm.  Dr. Thornton PapasJeffrey Anderson was notified of the results via fax and informed of his CPAP appointment date.

## 2014-12-30 ENCOUNTER — Ambulatory Visit (INDEPENDENT_AMBULATORY_CARE_PROVIDER_SITE_OTHER): Payer: BC Managed Care – PPO | Admitting: Physician Assistant

## 2014-12-30 VITALS — BP 120/76 | HR 77 | Temp 97.7°F | Resp 18 | Ht 67.0 in | Wt 328.8 lb

## 2014-12-30 DIAGNOSIS — K219 Gastro-esophageal reflux disease without esophagitis: Secondary | ICD-10-CM | POA: Diagnosis not present

## 2014-12-30 DIAGNOSIS — R202 Paresthesia of skin: Secondary | ICD-10-CM | POA: Diagnosis not present

## 2014-12-30 DIAGNOSIS — R079 Chest pain, unspecified: Secondary | ICD-10-CM

## 2014-12-30 MED ORDER — OMEPRAZOLE 40 MG PO CPDR: 40.0000 mg | DELAYED_RELEASE_CAPSULE | Freq: Every day | ORAL | Status: DC

## 2014-12-30 NOTE — Patient Instructions (Signed)
The chest pain is not concerning for a cardiac or lung cause at this time. Please let us know if these episodes continue to come back. Please go to the ED asap if they return and do not go away.  They may be associated with worsening GERD. Please try taking the 40 mg omeprazole daily for the next few weeks to see if this helps. We'll talk next week to see if this helped prevent any further chest pain episodes.  We drew some labs to monitor that may be causing the tingling in the hand.  Let's avoid any vibratory activities at work for the next week. I'd like to have you come back to see me in one week to see if this helps at all.   Gastroesophageal Reflux Disease, Adult Gastroesophageal reflux disease (GERD) happens when acid from your stomach flows up into the esophagus. When acid comes in contact with the esophagus, the acid causes soreness (inflammation) in the esophagus. Over time, GERD may create small holes (ulcers) in the lining of the esophagus. CAUSES   Increased body weight. This puts pressure on the stomach, making acid rise from the stomach into the esophagus.  Smoking. This increases acid production in the stomach.  Drinking alcohol. This causes decreased pressure in the lower esophageal sphincter (valve or ring of muscle between the esophagus and stomach), allowing acid from the stomach into the esophagus.  Late evening meals and a full stomach. This increases pressure and acid production in the stomach.  A malformed lower esophageal sphincter. Sometimes, no cause is found. SYMPTOMS   Burning pain in the lower part of the mid-chest behind the breastbone and in the mid-stomach area. This may occur twice a week or more often.  Trouble swallowing.  Sore throat.  Dry cough.  Asthma-like symptoms including chest tightness, shortness of breath, or wheezing. DIAGNOSIS  Your caregiver may be able to diagnose GERD based on your symptoms. In some cases, X-rays and other tests may be  done to check for complications or to check the condition of your stomach and esophagus. TREATMENT  Your caregiver may recommend over-the-counter or prescription medicines to help decrease acid production. Ask your caregiver before starting or adding any new medicines.  HOME CARE INSTRUCTIONS   Change the factors that you can control. Ask your caregiver for guidance concerning weight loss, quitting smoking, and alcohol consumption.  Avoid foods and drinks that make your symptoms worse, such as:  Caffeine or alcoholic drinks.  Chocolate.  Peppermint or mint flavorings.  Garlic and onions.  Spicy foods.  Citrus fruits, such as oranges, lemons, or limes.  Tomato-based foods such as sauce, chili, salsa, and pizza.  Fried and fatty foods.  Avoid lying down for the 3 hours prior to your bedtime or prior to taking a nap.  Eat small, frequent meals instead of large meals.  Wear loose-fitting clothing. Do not wear anything tight around your waist that causes pressure on your stomach.  Raise the head of your bed 6 to 8 inches with wood blocks to help you sleep. Extra pillows will not help.  Only take over-the-counter or prescription medicines for pain, discomfort, or fever as directed by your caregiver.  Do not take aspirin, ibuprofen, or other nonsteroidal anti-inflammatory drugs (NSAIDs). SEEK IMMEDIATE MEDICAL CARE IF:   You have pain in your arms, neck, jaw, teeth, or back.  Your pain increases or changes in intensity or duration.  You develop nausea, vomiting, or sweating (diaphoresis).  You develop shortness of  breath, or you faint.  Your vomit is green, yellow, black, or looks like coffee grounds or blood.  Your stool is red, bloody, or black. These symptoms could be signs of other problems, such as heart disease, gastric bleeding, or esophageal bleeding. MAKE SURE YOU:   Understand these instructions.  Will watch your condition.  Will get help right away if you  are not doing well or get worse. Document Released: 07/13/2005 Document Revised: 12/26/2011 Document Reviewed: 04/22/2011 [facility] Patient Information 2015 [place], [place]. This information is not intended to replace advice given to you by your health care provider. Make sure you discuss any questions you have with your health care provider.

## 2014-12-30 NOTE — Progress Notes (Signed)
Subjective:    Patient ID: Brian Harding, male    DOB: May 20, 1986, 29 y.o.   MRN: [id number]  Chief Complaint  Patient presents with  . Numbness    right side   Patient Active Problem List   Diagnosis Date Noted  . Obesity, unspecified 03/17/2014   Prior to Admission medications   Medication Sig Start Date End Date Taking? Authorizing Provider  omeprazole (PRILOSEC) 40 MG capsule Take 1 capsule (40 mg total) by mouth daily. 12/30/14   Donnajean Lopesodd M Philo Kurtz, PA   Medications, allergies, past medical history, surgical history, family history, social history and problem list reviewed and updated.  HPI  [age] yom with pmh gerd presents with right hand numbness.  Numbness is actually a "tingling" sensation. Denies any hand numbness. Sx started 6 days ago with sudden onset 5/10 sscp which occurred while lying in bed. CP resolved on own after few mins. No radiation. Described it as tightness. Denied assoc sob, palps, presyncope, syncope. Denies recent unilateral leg pain/swelling, denies recent immobilization.   The next morning after the cp episode he noticed his right hand felt tingly across palm and back of hand. Fingers weren't involved. He works as Arboriculturistcustodian and mentions he he is working certain repetitive motions make it tingle more. Denies actual pain, denies weakness, denies numbness. Tingling has been fairly constant over past few days but worsens while at work. Not doing any new motions or tasks at work. He has been using a vibrating buffer at work for past 8 yrs, uses both hands. One of the large garbage bins has a wheel loose on it which vibrates when he pushes it. He does this with his right hand. Wheel has been loose past couple months.   Two days ago he had a 2nd cp episode similar to the above described. Again resolved on own after couple mins. This episode also occurred while lying in bed. Has hx gerd. Currently feels he is well controlled on 150 mg zantac qhs.   Denies cp, doe with  work, while walking, while active. Only two episodes have been these two which were at rest. No fam hx early cad. Grandfather had heart issues in his 3000s. Otherwise negative.   PERC negative.   Review of Systems No HA, vision changes.     Objective:   Physical Exam  Constitutional: He is oriented to person, place, and time. He appears well-developed and well-nourished.  Non-toxic appearance. He does not have a sickly appearance. He does not appear ill. No distress.  BP 120/76 mmHg  Pulse 77  Temp(Src) 97.7 F (36.5 C) (Oral)  Resp 18  Ht 5\' 7"  (1.702 m)  Wt 328 lb 12.8 oz (149.143 kg)  BMI 51.49 kg/m2  SpO2 97%   Eyes: Conjunctivae and EOM are normal. Pupils are equal, round, and reactive to light.  Neck: Normal range of motion and full passive range of motion without pain. No spinous process tenderness and no muscular tenderness present. Carotid bruit is not present. Normal range of motion present.  Cardiovascular: Normal rate, regular rhythm and normal heart sounds.  Exam reveals no gallop.   No murmur heard. Pulses:      Dorsalis pedis pulses are 2+ on the right side, and 2+ on the left side.       Posterior tibial pulses are 2+ on the right side, and 2+ on the left side.  Pulmonary/Chest: Effort normal and breath sounds normal. He has no decreased breath sounds. He has no wheezes.  He has no rhonchi. He has no rales. He exhibits no tenderness and no bony tenderness.  Abdominal: Soft. Normal appearance and bowel sounds are normal. There is no tenderness.  Musculoskeletal:       Right shoulder: Normal. He exhibits normal range of motion, no tenderness and no bony tenderness.       Right elbow: Normal.He exhibits normal range of motion and no swelling. No tenderness found.       Right wrist: Normal. He exhibits normal range of motion, no tenderness and no bony tenderness.       Right hand: He exhibits no tenderness, no bony tenderness, normal capillary refill and no swelling. Normal  sensation noted. Normal strength noted.       Hands: Tingling sensation across circled areas. Normal sensation, strength testing. Normal cap refill. 2+ radial pulse. Normal rom.  Negative spurling test. Negative tinels and phalens tests. Negative finkelsteins test.   Neurological: He is alert and oriented to person, place, and time. He has normal strength. No cranial nerve deficit or sensory deficit. He displays a negative Romberg sign. Coordination and gait normal.  Negative rapid alternating movements, negative heel to shin.   Skin: Skin is warm and dry. No rash noted. Nails show no clubbing.  Psychiatric: He has a normal mood and affect. His speech is normal and behavior is normal.      Assessment & Plan:   29 yom with pmh gerd presents with right hand numbness.  Right hand paresthesia - Plan: Vitamin B12, Basic metabolic panel, Magnesium --normal neuro exam --neg musculoskeletal testing --b12, lytes today --suspect due to vibrating equipment at work, note to refrain from these vibrating tasks for next week --rtc one week for f/u  Chest pain, unspecified chest pain type - Plan: omeprazole (PRILOSEC) 40 MG capsule Gastroesophageal reflux disease, esophagitis presence not specified - Plan: omeprazole (PRILOSEC) 40 MG capsule --doubt cardiac cause in this 29 yom with no significant early cad family hx, no exertional cp even with moderate activity/lifting daily at work --[facility] neg, doubt PE --vitals stable today --suspect worsening gerd --> trial 40 mg omeprazole qhs, rtc one week for right hand f/u, will assess cp/gerd at that time --ER if cp worsens or returns and lasts for longer period or does not resolve  Donnajean Lopes, PA-C Physician Assistant-Certified [facility]  12/31/2014 7:00 AM

## 2014-12-31 LAB — BASIC METABOLIC PANEL
BUN: 17 mg/dL (ref 6–23)
CO2: 22 mEq/L (ref 19–32)
Calcium: 9.4 mg/dL (ref 8.4–10.5)
Chloride: 105 mEq/L (ref 96–112)
Creat: 0.85 mg/dL (ref 0.50–1.35)
Glucose, Bld: 89 mg/dL (ref 70–99)
POTASSIUM: 4.1 meq/L (ref 3.5–5.3)
Sodium: 138 mEq/L (ref 135–145)

## 2014-12-31 LAB — MAGNESIUM: MAGNESIUM: 1.9 mg/dL (ref 1.5–2.5)

## 2014-12-31 LAB — VITAMIN B12: Vitamin B-12: 423 pg/mL (ref 211–911)

## 2015-01-01 ENCOUNTER — Ambulatory Visit (INDEPENDENT_AMBULATORY_CARE_PROVIDER_SITE_OTHER): Payer: BC Managed Care – PPO | Admitting: Neurology

## 2015-01-01 VITALS — BP 128/69 | HR 80 | Ht 67.0 in | Wt 310.0 lb

## 2015-01-01 DIAGNOSIS — G4733 Obstructive sleep apnea (adult) (pediatric): Secondary | ICD-10-CM | POA: Diagnosis not present

## 2015-01-01 DIAGNOSIS — G479 Sleep disorder, unspecified: Secondary | ICD-10-CM

## 2015-01-02 NOTE — Sleep Study (Signed)
See scanned documents in Encounters tab 

## 2015-01-09 ENCOUNTER — Telehealth: Payer: Self-pay | Admitting: Neurology

## 2015-01-09 DIAGNOSIS — G4733 Obstructive sleep apnea (adult) (pediatric): Secondary | ICD-10-CM

## 2015-01-09 NOTE — Telephone Encounter (Signed)
Please call and inform patient that I have entered an order for treatment with PAP. He did well during the latest sleep study with CPAP. We will, therefore, arrange for a machine for home use through a DME (durable medical equipment) company of His choice; and I will see the patient back in follow-up in about 6 weeks. Please also explain to the patient that I will be looking out for compliance data downloaded from the machine, which can be done remotely through a modem at times or stored on an SD card in the back of the machine. At the time of the followup appointment we will discuss sleep study results and how it is going with PAP treatment at home. Please advise patient to bring His machine at the time of the visit; at least for the first visit, even though this is cumbersome. Bringing the machine for every visit after that may not be needed, but often helps for the first visit. Please also make sure, the patient has a follow-up appointment with me in about 6 weeks from the setup date, thanks.   Maeley Matton, MD, PhD [facility] ([facility])  

## 2015-01-10 ENCOUNTER — Encounter: Payer: Self-pay | Admitting: Neurology

## 2015-01-12 ENCOUNTER — Encounter: Payer: Self-pay | Admitting: *Deleted

## 2015-01-12 NOTE — Telephone Encounter (Signed)
The patient was contacted and provided the results of his CPAP titration sleep study which was considered effective in treatment.  The patient was [facility] for [facility] set up.  Dr. Thornton PapasJeffrey Anderson was routed a copy of the report.   Patient instructed to contact our office 6-8 weeks post set up to schedule a follow up appointment.  The patient gave verbal permission to mail a copy of his test results.

## 2015-01-26 ENCOUNTER — Telehealth: Payer: Self-pay | Admitting: *Deleted

## 2015-01-26 NOTE — Telephone Encounter (Signed)
Patient had a recent sleep study in which a diagnosis of OSA was determined.  He was referred to [facility] for CPAP set up.  Patient has refused CPAP equipment due to cost.

## 2015-01-26 NOTE — Telephone Encounter (Signed)
Pls offer FU appointment so we can go over test results and discuss alternative Rx.

## 2015-01-27 NOTE — Telephone Encounter (Signed)
I spoke to Bloom[place]dale[place]ua and he will come in for an appt on 5/9.

## 2015-02-20 ENCOUNTER — Telehealth: Payer: Self-pay

## 2015-02-20 NOTE — Telephone Encounter (Signed)
I called patient to ask a questions about appt on Monday. Patient informed me that he had a work conflict, so we scheduled for a more convenient time for him.

## 2015-02-23 ENCOUNTER — Ambulatory Visit: Payer: Self-pay | Admitting: Neurology

## 2015-03-03 ENCOUNTER — Ambulatory Visit: Payer: Self-pay | Admitting: Neurology

## 2015-03-17 ENCOUNTER — Encounter: Payer: Self-pay | Admitting: Neurology

## 2015-03-17 ENCOUNTER — Ambulatory Visit (INDEPENDENT_AMBULATORY_CARE_PROVIDER_SITE_OTHER): Payer: BC Managed Care – PPO | Admitting: Neurology

## 2015-03-17 VITALS — BP 116/71 | HR 66 | Resp 18 | Ht 67.0 in | Wt 320.0 lb

## 2015-03-17 DIAGNOSIS — G4733 Obstructive sleep apnea (adult) (pediatric): Secondary | ICD-10-CM | POA: Diagnosis not present

## 2015-03-17 NOTE — Progress Notes (Signed)
Subjective:    Patient ID: Brian Harding is a 29 y.o. male.  HPI     Interim history:  [name] is a 29 year old right-handed gentleman with an underlying medical history of morbid obesity, who presents for follow-up consultation of this obstructive sleep apnea, after his recent sleep studies. The patient is accompanied by his GF again today. I first met him on 10/06/2014 at the request of his primary care provider, at which time he reported snoring and nonrestorative sleep. I invited him back for sleep study. He had a baseline sleep study on 12/05/2014, followed by a CPAP titration study on 01/01/2015. I went over his test results with him in detail today. His baseline sleep study from 12/05/2014 showed a sleep efficiency of only 56.2% with a prolonged sleep latency of 92.5 minutes and wake after sleep onset of 113.5 minutes with moderate sleep fragmentation noted. He had an elevated arousal index. He had fairly normal percentages of sleep stages including deep sleep and REM sleep. REM latency was prolonged at 177 minutes. Mild PLMS were noted with no arousals. There were no EKG or EEG changes. He had mild to moderate intermittent snoring. He had a total AHI of 27.5 per hour. Baseline oxygen saturation was 93%, nadir was 79%. Based on the test results I invited him back for full night CPAP titration study. He had the study on 01/01/2015. Sleep efficiency was 78.7%. Sleep latency was 33 minutes. Wake after sleep onset was 50.5 minutes. He had an increased percentage of slow-wave sleep and a decreased percentage of REM sleep with a prolonged REM latency. He had no significant PLMS. He had no significant EKG or EEG changes. CPAP was titrated from 5 cm to 11 cm of pressure. AHI was reduced to 0 per hour on a pressure of 10 cm with supine REM sleep achieved during the study. He indicated that he had slept better than usual. Based on the sleep test results I prescribed CPAP therapy for home use. The patient  declined therapy due to cost.  Today, 03/17/2015: He reports that he could not afford the co-pay and the monthly payments towards his CPAP machine. He had gained weight after December 2015 up to 235 pounds and is now trying to lose weight. He has lost about 15 pounds since then. He has no other new sleep related complaints. He does report a spider bite about 3 weeks ago. He says it was a brown recluse and he had a spider bite in the inner thigh area on the right.  Previously:  He has multiple night time awakenings. He has nocturia at least once per night. He has no morning HAs. He has no RLS symptoms and in not known to kick in his sleep. He has no FHx of OSA.  He works from 10:30 AM to 7 PM. He smokes a vapor cig. He drinks multiple glasses of tea and sodas per day. His usual wake time is around 6:30 AM. Sleep onset typically occurs within minutes. He reports feeling marginally rested upon awakening. He wakes up multiple times in the middle of the night. He does not typically nap but does sometimes fall asleep when he does not mean to. He has never fallen asleep while driving. There is no TV in his bedroom.    His Past Medical History Is Significant For: No past medical history on file.  His Past Surgical History Is Significant For: No past surgical history on file.  His Family History Is Significant For: Family  History  Problem Relation Age of Onset  . Diabetes Mother     His Social History Is Significant For: History   Social History  . Marital Status: Single    Spouse Name: N/A  . Number of Children: 0  . Years of Education: 12   Occupational History  .      Radio broadcast assistant   Social History Main Topics  . Smoking status: Former Research scientist (life sciences)  . Smokeless tobacco: Current User     Comment: E cigs/ Quit 11/2014   . Alcohol Use: No     Comment: occas.  . Drug Use: No  . Sexual Activity: Yes   Other Topics Concern  . None   Social History Narrative    His Allergies Are:   No Known Allergies:   His Current Medications Are:  Outpatient Encounter Prescriptions as of 03/17/2015  Medication Sig  . traMADol (ULTRAM) 50 MG tablet Take 100 mg by mouth every 6 (six) hours as needed.  . [DISCONTINUED] omeprazole (PRILOSEC) 40 MG capsule Take 1 capsule (40 mg total) by mouth daily.   No facility-administered encounter medications on file as of 03/17/2015.  :  Review of Systems:  Out of a complete 14 point review of systems, all are reviewed and negative with the exception of these symptoms as listed below:   Review of Systems  Constitutional: Positive for fatigue.    Objective:  Neurologic Exam  Physical Exam Physical Examination:   Filed Vitals:   03/17/15 1505  BP: 116/71  Pulse: 66  Resp: 18    General Examination: The patient is a very pleasant 29 y.o. male in no acute distress. He appears well-developed and well-nourished and adequately groomed.   HEENT: Normocephalic, atraumatic, pupils are equal, round and reactive to light and accommodation. Extraocular tracking is good without limitation to gaze excursion or nystagmus noted. Normal smooth pursuit is noted. Hearing is grossly intact. Face is symmetric with normal facial animation and normal facial sensation. Speech is clear with no dysarthria noted. There is no hypophonia. There is no lip, neck/head, jaw or voice tremor. Neck is supple with full range of passive and active motion. There are no carotid bruits on auscultation. Oropharynx exam reveals: mild mouth dryness, adequate dental hygiene and moderate airway crowding, due to redundant soft palate, large uvula, tonsils of 2+ and thicker tongue. Mallampati is class II. Tongue protrudes centrally and palate elevates symmetrically.   Chest: Clear to auscultation without wheezing, rhonchi or crackles noted.  Heart: S1+S2+0, regular and normal without murmurs, rubs or gallops noted.   Abdomen: Soft, non-tender and non-distended with normal bowel  sounds appreciated on auscultation.  Extremities: There is no pitting edema in the distal lower extremities bilaterally. Pedal pulses are intact.  Skin: Warm and dry without trophic changes noted. There are no varicose veins.  Musculoskeletal: exam reveals no obvious joint deformities, tenderness or joint swelling or erythema.   Neurologically:  Mental status: The patient is awake, alert and oriented in all 4 spheres. His immediate and remote memory, attention, language skills and fund of knowledge are appropriate. There is no evidence of aphasia, agnosia, apraxia or anomia. Speech is clear with normal prosody and enunciation. Thought process is linear. Mood is normal and affect is normal.  Cranial nerves II - XII are as described above under HEENT exam. In addition: shoulder shrug is normal with equal shoulder height noted. Motor exam: Normal bulk, strength and tone is noted. There is no drift, tremor or rebound. Romberg is  negative. Reflexes are 2+ throughout. Babinski: Toes are flexor bilaterally. Fine motor skills and coordination: intact.  Cerebellar testing: No dysmetria or intention tremor on finger to nose testing. Heel to shin is unremarkable bilaterally. There is no truncal or gait ataxia.  Sensory exam: intact to light touch in the upper and lower extremities.  Gait, station and balance: He stands easily. No veering to one side is noted. No leaning to one side is noted. Posture is age-appropriate and stance is narrow based. Gait shows normal stride length and normal pace. No problems turning are noted. He turns en bloc. Tandem walk is unremarkable.  Assessment and Plan:   In summary, Brian Harding is a very pleasant 29 year old male with an underlying medical history of morbid obesity, who presents for follow-up consultation after his recent sleep study. His sleep study and his CPAP titration study from February and March 2016 for explained to him. He has moderate obstructive sleep  apnea. He was not able to afford CPAP treatment. His physical exam is stable. I talked to him about his test results in detail and also about potential alternatives to obstructive sleep apnea treatment. He is encouraged to try to continue to lose weight which will be part of his treatment. I think our best treatment option is still CPAP therapy. He is advised that we will try to refer him to another DME company. He is in agreement. I again explained to them the risks and ramifications of untreated moderate to severe OSA. I will see him back in about 2 months or so. In the interim, we will refer him to another DME company. He is also advised to continue to strive for weight loss. I answered all their questions today and the patient and his GF were in agreement.  I spent 20 minutes in total face-to-face time with the patient, more than 50% of which was spent in counseling and coordination of care, reviewing test results, reviewing medication and discussing or reviewing the diagnosis of OSA, its prognosis and treatment options.

## 2015-03-17 NOTE — Patient Instructions (Signed)
We will re-refer you to another DME company, other than [facility], to help you with treatment of your moderate sleep apnea.   I will see you back in 2 to 3 months.

## 2015-06-05 ENCOUNTER — Ambulatory Visit: Payer: BC Managed Care – PPO | Admitting: Skilled Nursing Facility1

## 2015-07-16 ENCOUNTER — Ambulatory Visit: Payer: BC Managed Care – PPO | Admitting: Dietician

## 2015-07-23 ENCOUNTER — Ambulatory Visit: Payer: BC Managed Care – PPO | Admitting: Dietician

## 2015-07-23 ENCOUNTER — Ambulatory Visit: Payer: BC Managed Care – PPO | Admitting: *Deleted

## 2015-07-23 ENCOUNTER — Encounter: Payer: Self-pay | Admitting: Dietician

## 2015-08-04 ENCOUNTER — Ambulatory Visit (INDEPENDENT_AMBULATORY_CARE_PROVIDER_SITE_OTHER): Payer: BC Managed Care – PPO | Admitting: Emergency Medicine

## 2015-08-04 VITALS — BP 124/64 | HR 68 | Temp 98.3°F | Resp 18 | Ht 67.0 in | Wt 322.0 lb

## 2015-08-04 DIAGNOSIS — G4733 Obstructive sleep apnea (adult) (pediatric): Secondary | ICD-10-CM

## 2015-08-04 DIAGNOSIS — J0140 Acute pansinusitis, unspecified: Secondary | ICD-10-CM

## 2015-08-04 DIAGNOSIS — S335XXA Sprain of ligaments of lumbar spine, initial encounter: Secondary | ICD-10-CM

## 2015-08-04 MED ORDER — TRAMADOL HCL 50 MG PO TABS: 100.0000 mg | ORAL_TABLET | Freq: Four times a day (QID) | ORAL | Status: DC | PRN

## 2015-08-04 MED ORDER — CYCLOBENZAPRINE HCL 5 MG PO TABS: 5.0000 mg | ORAL_TABLET | Freq: Three times a day (TID) | ORAL | Status: DC | PRN

## 2015-08-04 MED ORDER — DICLOFENAC SODIUM 50 MG PO TBEC: 50.0000 mg | DELAYED_RELEASE_TABLET | Freq: Two times a day (BID) | ORAL | Status: DC

## 2015-08-04 MED ORDER — PSEUDOEPHEDRINE-GUAIFENESIN ER 60-600 MG PO TB12: 1.0000 | ORAL_TABLET | Freq: Two times a day (BID) | ORAL | Status: AC

## 2015-08-04 MED ORDER — AMOXICILLIN-POT CLAVULANATE 875-125 MG PO TABS: 1.0000 | ORAL_TABLET | Freq: Two times a day (BID) | ORAL | Status: DC

## 2015-08-04 NOTE — Patient Instructions (Signed)
Sleep Apnea  Sleep apnea is a sleep disorder characterized by abnormal pauses in breathing while you sleep. When your breathing pauses, the level of oxygen in your blood decreases. This causes you to move out of deep sleep and into light sleep. As a result, your quality of sleep is poor, and the system that carries your blood throughout your body (cardiovascular system) experiences stress. If sleep apnea remains untreated, the following conditions can develop:  High blood pressure (hypertension).  Coronary artery disease.  Inability to achieve or maintain an erection (impotence).  Impairment of your thought process (cognitive dysfunction). There are three types of sleep apnea: 1. Obstructive sleep apnea--Pauses in breathing during sleep because of a blocked airway. 2. Central sleep apnea--Pauses in breathing during sleep because the area of the brain that controls your breathing does not send the correct signals to the muscles that control breathing. 3. Mixed sleep apnea--A combination of both obstructive and central sleep apnea. RISK FACTORS The following risk factors can increase your risk of developing sleep apnea:  Being overweight.  Smoking.  Having narrow passages in your nose and throat.  Being of older age.  Being male.  Alcohol use.  Sedative and tranquilizer use.  Ethnicity. Among individuals younger than 35 years, African Americans are at increased risk of sleep apnea. SYMPTOMS   Difficulty staying asleep.  Daytime sleepiness and fatigue.  Loss of energy.  Irritability.  Loud, heavy snoring.  Morning headaches.  Trouble concentrating.  Forgetfulness.  Decreased interest in sex.  Unexplained sleepiness. DIAGNOSIS  In order to diagnose sleep apnea, your caregiver will perform a physical examination. A sleep study done in the comfort of your own home may be appropriate if you are otherwise healthy. Your caregiver may also recommend that you spend the  night in a sleep lab. In the sleep lab, several monitors record information about your heart, lungs, and brain while you sleep. Your leg and arm movements and blood oxygen level are also recorded. TREATMENT The following actions may help to resolve mild sleep apnea:  Sleeping on your side.   Using a decongestant if you have nasal congestion.   Avoiding the use of depressants, including alcohol, sedatives, and narcotics.   Losing weight and modifying your diet if you are overweight. There also are devices and treatments to help open your airway:  Oral appliances. These are custom-made mouthpieces that shift your lower jaw forward and slightly open your bite. This opens your airway.  Devices that create positive airway pressure. This positive pressure "splints" your airway open to help you breathe better during sleep. The following devices create positive airway pressure:  Continuous positive airway pressure (CPAP) device. The CPAP device creates a continuous level of air pressure with an air pump. The air is delivered to your airway through a mask while you sleep. This continuous pressure keeps your airway open.  Nasal expiratory positive airway pressure (EPAP) device. The EPAP device creates positive air pressure as you exhale. The device consists of single-use valves, which are inserted into each nostril and held in place by adhesive. The valves create very little resistance when you inhale but create much more resistance when you exhale. That increased resistance creates the positive airway pressure. This positive pressure while you exhale keeps your airway open, making it easier to breath when you inhale again.  Bilevel positive airway pressure (BPAP) device. The BPAP device is used mainly in patients with central sleep apnea. This device is similar to the CPAP device because   it also uses an air pump to deliver continuous air pressure through a mask. However, with the BPAP machine, the  pressure is set at two different levels. The pressure when you exhale is lower than the pressure when you inhale.  Surgery. Typically, surgery is only done if you cannot comply with less invasive treatments or if the less invasive treatments do not improve your condition. Surgery involves removing excess tissue in your airway to create a wider passage way.   This information is not intended to replace advice given to you by your health care provider. Make sure you discuss any questions you have with your health care provider.   Document Released: 09/23/2002 Document Revised: 10/24/2014 Document Reviewed: 02/09/2012 Elsevier Interactive Patient Education 2016 [employer] Inc[employer]   

## 2015-08-04 NOTE — Progress Notes (Signed)
Subjective:  Patient ID: Brian Harding, male    DOB: 01-Aug-1986  Age: 29 y.o. MRN: [id number]  CC: Back Pain; Gastroesophageal Reflux; and Sinusitis   HPI [name] presents  with recurrent back pain. He's been under treatment for an orthopedic surgeon with the complications of back injury. He has run out of his medication and has an appointment on November 4. Apparently missed an appointment last week with his surgeon. He spending some back surgery that he doesn't know the name of and that's depended on his losing a significant amount weight. He now comes the office complaining of increasing pain with no radiation numbness or tingling or weakness in his lower extremities. He has no history of acute injury.  He has nasal congestion postnasal drainage of purulent color he has nasal congestion. He has no fever or chills. No wheezing or shortness of breath or cough. Been unable to control symptoms over-the-counter medication  He has undergone a sleep study and was found to have obstructive sleep apnea and was unable to obtain CPAP machine due to some administrative issue with the treating physician.  History Brian Harding has a past medical history of Allergy.   He has no past surgical history on file.   His  family history includes Diabetes in his mother.  He   reports that he has quit smoking. He uses smokeless tobacco. He reports that he does not drink alcohol or use illicit drugs.  Outpatient Prescriptions Prior to Visit  Medication Sig Dispense Refill  . traMADol (ULTRAM) 50 MG tablet Take 100 mg by mouth every 6 (six) hours as needed.     No facility-administered medications prior to visit.    Social History   Social History  . Marital Status: Single    Spouse Name: N/A  . Number of Children: 0  . Years of Education: 12   Occupational History  .      Financial controller   Social History Main Topics  . Smoking status: Former Games developer  . Smokeless tobacco: Current User   Comment: E cigs/ Quit 11/2014   . Alcohol Use: No     Comment: occas.  . Drug Use: No  . Sexual Activity: Yes   Other Topics Concern  . None   Social History Narrative     Review of Systems  Constitutional: Negative for fever, chills and appetite change.  HENT: Positive for congestion, postnasal drip, rhinorrhea, sinus pressure and sneezing. Negative for ear pain and sore throat.   Eyes: Negative for pain and redness.  Respiratory: Positive for cough. Negative for shortness of breath and wheezing.   Cardiovascular: Negative for leg swelling.  Gastrointestinal: Negative for nausea, vomiting, abdominal pain, diarrhea, constipation and blood in stool.  Endocrine: Negative for polyuria.  Genitourinary: Negative for dysuria, urgency, frequency and flank pain.  Musculoskeletal: Positive for back pain. Negative for gait problem.  Skin: Negative for rash.  Neurological: Negative for weakness and headaches.  Psychiatric/Behavioral: Negative for confusion and decreased concentration. The patient is not nervous/anxious.     Objective:  BP 124/64 mmHg  Pulse 68  Temp(Src) 98.3 F (36.8 C) (Oral)  Resp 18  Ht  (1.702 m)  Wt 322 lb (146.058 kg)  BMI 50.42 kg/m2  SpO2 98%  Physical Exam  Constitutional: He is oriented to person, place, and time. He appears well-developed and well-nourished. No distress.  HENT:  Head: Normocephalic and atraumatic.  Right Ear: External ear normal.  Left Ear: External ear normal.  Nose:  Nose normal.  Eyes: Conjunctivae and EOM are normal. Pupils are equal, round, and reactive to light. No scleral icterus.  Neck: Normal range of motion. Neck supple. No tracheal deviation present.  Cardiovascular: Normal rate, regular rhythm and normal heart sounds.   Pulmonary/Chest: Effort normal. No respiratory distress. He has no wheezes. He has no rales.  Abdominal: He exhibits no mass. There is no tenderness. There is no rebound and no guarding.    Musculoskeletal: He exhibits no edema.  Lymphadenopathy:    He has no cervical adenopathy.  Neurological: He is alert and oriented to person, place, and time. Coordination normal.  Skin: Skin is warm and dry. No rash noted.  Psychiatric: He has a normal mood and affect. His behavior is normal.      Assessment & Plan:   Ivin BootyJoshua was seen today for back pain, gastroesophageal reflux and sinusitis.  Diagnoses and all orders for this visit:  Acute pansinusitis, recurrence not specified  Sprain of lumbar region, initial encounter  Obstructive sleep apnea -     For home use only DME continuous positive airway pressure (CPAP)  Other orders -     cyclobenzaprine (FLEXERIL) 5 MG tablet; Take 1 tablet (5 mg total) by mouth 3 (three) times daily as needed for muscle spasms. -     traMADol (ULTRAM) 50 MG tablet; Take 2 tablets (100 mg total) by mouth every 6 (six) hours as needed. -     amoxicillin-clavulanate (AUGMENTIN) 875-125 MG tablet; Take 1 tablet by mouth 2 (two) times daily. -     pseudoephedrine-guaifenesin (MUCINEX D) 60-600 MG 12 hr tablet; Take 1 tablet by mouth every 12 (twelve) hours. -     diclofenac (VOLTAREN) 50 MG EC tablet; Take 1 tablet (50 mg total) by mouth 2 (two) times daily.   I have changed [name]'s traMADol. I am also having him start on cyclobenzaprine, amoxicillin-clavulanate, pseudoephedrine-guaifenesin, and diclofenac.  Meds ordered this encounter  Medications  . cyclobenzaprine (FLEXERIL) 5 MG tablet    Sig: Take 1 tablet (5 mg total) by mouth 3 (three) times daily as needed for muscle spasms.    Dispense:  30 tablet    Refill:  0  . traMADol (ULTRAM) 50 MG tablet    Sig: Take 2 tablets (100 mg total) by mouth every 6 (six) hours as needed.    Dispense:  45 tablet    Refill:  0  . amoxicillin-clavulanate (AUGMENTIN) 875-125 MG tablet    Sig: Take 1 tablet by mouth 2 (two) times daily.    Dispense:  20 tablet    Refill:  0  .  pseudoephedrine-guaifenesin (MUCINEX D) 60-600 MG 12 hr tablet    Sig: Take 1 tablet by mouth every 12 (twelve) hours.    Dispense:  18 tablet    Refill:  0  . diclofenac (VOLTAREN) 50 MG EC tablet    Sig: Take 1 tablet (50 mg total) by mouth 2 (two) times daily.    Dispense:  45 tablet    Refill:  0    Appropriate red flag conditions were discussed with the patient as well as actions that should be taken.  Patient expressed his understanding.  Follow-up: No Follow-up on file.  Carmelina DaneAnderson, Mallissa Lorenzen S, MD

## 2015-08-06 ENCOUNTER — Telehealth: Payer: Self-pay

## 2015-08-06 NOTE — Telephone Encounter (Signed)
Patient is not feeling better and per Dr. Dareen PianoAnderson, he could ask for an extension on his return to work letter. Patient was given a excuse for work for him to return on Monday. Advised him to return to the clinic if he was not feeling better by Monday.

## 2015-08-12 ENCOUNTER — Telehealth: Payer: Self-pay

## 2015-08-12 NOTE — Telephone Encounter (Signed)
Dr Dareen PianoAnderson, I have placed a fax in your box which is just asking for answers to be verified that they have taken from our original order. Please review/sign and fax. Thanks!

## 2015-12-09 ENCOUNTER — Encounter: Payer: Self-pay | Admitting: Gastroenterology

## 2016-02-02 ENCOUNTER — Ambulatory Visit: Payer: BC Managed Care – PPO | Admitting: Gastroenterology

## 2016-09-29 ENCOUNTER — Emergency Department (HOSPITAL_COMMUNITY)
Admission: EM | Admit: 2016-09-29 | Discharge: 2016-09-29 | Disposition: A | Discharge status: Home / Self Care | Payer: BC Managed Care – PPO | Attending: Emergency Medicine | Admitting: Emergency Medicine

## 2016-09-29 ENCOUNTER — Encounter (HOSPITAL_COMMUNITY): Payer: Self-pay | Admitting: Emergency Medicine

## 2016-09-29 DIAGNOSIS — Z77098 Contact with and (suspected) exposure to other hazardous, chiefly nonmedicinal, chemicals: Secondary | ICD-10-CM

## 2016-09-29 DIAGNOSIS — Z87891 Personal history of nicotine dependence: Secondary | ICD-10-CM | POA: Insufficient documentation

## 2016-09-29 HISTORY — DX: Prediabetes: R73.03

## 2016-09-29 MED ORDER — ALBUTEROL SULFATE (2.5 MG/3ML) 0.083% IN NEBU: 2.5000 mg | INHALATION_SOLUTION | Freq: Once | RESPIRATORY_TRACT | Status: DC

## 2016-09-29 MED ORDER — PREDNISONE 20 MG PO TABS: 40.0000 mg | ORAL_TABLET | Freq: Every day | ORAL | 0 refills | Status: DC

## 2016-09-29 MED ORDER — PREDNISONE 20 MG PO TABS
60.0000 mg | ORAL_TABLET | ORAL | Status: AC
  Administered 2016-09-29: 60 mg via ORAL
  Filled 2016-09-29: qty 3

## 2016-09-29 MED ORDER — ALBUTEROL SULFATE (2.5 MG/3ML) 0.083% IN NEBU
2.5000 mg | INHALATION_SOLUTION | Freq: Once | RESPIRATORY_TRACT | Status: AC
  Administered 2016-09-29: 2.5 mg via RESPIRATORY_TRACT
  Filled 2016-09-29: qty 3

## 2016-09-29 NOTE — ED Triage Notes (Signed)
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

## 2016-09-29 NOTE — ED Notes (Signed)
Pt ambulatory and independent at discharge.  Verbalized understanding of discharge instructions 

## 2016-09-29 NOTE — ED Provider Notes (Signed)
[facility]-EMERGENCY DEPT Provider Note   CSN: [id number] Arrival date & time: 09/29/16  2032     History   Chief Complaint Chief Complaint  Patient presents with  . Chemical Exposure    HPI Brian CoppJoshua Harding is a 30 y.o. male.  HPI Patient presents with concern of dyspnea Symptoms began yesterday, at work, the patient was using a new cleaning solution. Patient works in [employer]. She now after using the product patient felt dyspneic, had multiple coughing fits. He had one episode of emesis. No additional emesis over the past 24 hours, no chest pain persistently, other than when coughing. No abdominal pain, no fever. Minimal relief with anything. Patient notes a history of frequent bronchitis, but denies history of asthma.    Past Medical History:  Diagnosis Date  . Allergy   . Prediabetes     Patient Active Problem List   Diagnosis Date Noted  . Obesity, unspecified 03/17/2014    History reviewed. No pertinent surgical history.     Home Medications    Prior to Admission medications   Medication Sig Start Date End Date Taking? Authorizing Provider  acetaminophen (TYLENOL) 500 MG tablet Take 1,000 mg by mouth every 6 (six) hours as needed for moderate pain.   Yes Historical Provider, MD  amoxicillin-clavulanate (AUGMENTIN) 875-125 MG tablet Take 1 tablet by mouth 2 (two) times daily. Patient not taking: Reported on 09/29/2016 08/04/15   Carmelina DaneJeffery S Anderson, MD  cyclobenzaprine (FLEXERIL) 5 MG tablet Take 1 tablet (5 mg total) by mouth 3 (three) times daily as needed for muscle spasms. Patient not taking: Reported on 09/29/2016 08/04/15   Carmelina DaneJeffery S Anderson, MD  diclofenac (VOLTAREN) 50 MG EC tablet Take 1 tablet (50 mg total) by mouth 2 (two) times daily. Patient not taking: Reported on 09/29/2016 08/04/15   Carmelina DaneJeffery S Anderson, MD  traMADol (ULTRAM) 50 MG tablet Take 2 tablets (100 mg total) by mouth every 6 (six) hours as needed. Patient not taking: Reported  on 09/29/2016 08/04/15   Carmelina DaneJeffery S Anderson, MD    Family History Family History  Problem Relation Age of Onset  . Diabetes Mother   . Hypertension Other     Social History Social History  Substance Use Topics  . Smoking status: Former Games developermoker  . Smokeless tobacco: Current User     Comment: E cigs/ Quit 11/2014   . Alcohol use No     Comment: former      Allergies   Patient has no known allergies.   Review of Systems Review of Systems  Constitutional:       Per HPI, otherwise negative  HENT:       Per HPI, otherwise negative  Respiratory:       Per HPI, otherwise negative  Cardiovascular:       Per HPI, otherwise negative  Gastrointestinal: Positive for nausea and vomiting. Negative for abdominal pain.  Endocrine:       Negative aside from HPI  Genitourinary:       Neg aside from HPI   Musculoskeletal:       Per HPI, otherwise negative  Skin: Negative.   Allergic/Immunologic: Negative for immunocompromised state.  Neurological: Negative for syncope.     Physical Exam Updated Vital Signs BP 133/80 (BP Location: Left Arm)   Pulse 88   Temp 98.8 F (37.1 C) (Oral)   Resp 20   Ht 5\' 7"  (1.702 m)   Wt (!) 326 lb (147.9 kg)   SpO2 96%  BMI 51.06 kg/m   Physical Exam  Constitutional: He is oriented to person, place, and time. He appears well-developed. No distress.  HENT:  Head: Normocephalic and atraumatic.  Eyes: Conjunctivae and EOM are normal.  Cardiovascular: Normal rate and regular rhythm.   Pulmonary/Chest: No stridor. He has decreased breath sounds. He has no wheezes.  Abdominal: He exhibits no distension.  Musculoskeletal: He exhibits no edema.  Neurological: He is alert and oriented to person, place, and time.  Skin: Skin is warm and dry.  Psychiatric: He has a normal mood and affect.  Nursing note and vitals reviewed.    ED Treatments / Results   EKG  EKG Interpretation  Date/Time:  Thursday September 29 2016 20:39:47  EST Ventricular Rate:  90 PR Interval:    QRS Duration: 89 QT Interval:  358 QTC Calculation: 438 R Axis:   73 Text Interpretation:  Sinus rhythm Low voltage, precordial leads Baseline wander in lead(s) V3 Artifact Abnormal ekg Confirmed by Gerhard MunchLOCKWOOD, Ansleigh Safer  MD [id number]) on 09/29/2016 10:28:07 PM        Procedures Procedures (including critical care time)  Medications Ordered in ED Medications  albuterol (PROVENTIL) (2.5 MG/3ML) 0.083% nebulizer solution 2.5 mg (not administered)  predniSONE (DELTASONE) tablet 60 mg (not administered)     Initial Impression / Assessment and Plan / ED Course  I have reviewed the triage vital signs and the nursing notes.  Pertinent labs & imaging results that were available during my care of the patient were reviewed by me and considered in my medical decision making (see chart for details).  Clinical Course     Patient feels better following albuterol treatment. He has started steroid therapy as well. With no evidence for pneumonia, no hypoxia, tachypnea, tachycardia, and with likely chemical pneumonitis, but no decompensation, the patient was discharged in stable condition with close outpatient follow-up.   Final Clinical Impressions(s) / ED Diagnoses  Dyspnea, likely secondary to pneumonitis   Gerhard Munchobert Basil Blakesley, MD 09/29/16 2314

## 2016-09-29 NOTE — Discharge Instructions (Signed)
As discussed, please use your albuterol every 4 hours for the next 2 days in addition to the prescribed steroids.  Monitor your condition carefully, and it do not hesitate to return here for concerning changes in your condition.

## 2017-01-05 ENCOUNTER — Encounter (HOSPITAL_COMMUNITY): Payer: Self-pay | Admitting: Emergency Medicine

## 2017-01-05 ENCOUNTER — Ambulatory Visit (HOSPITAL_COMMUNITY)
Admission: EM | Admit: 2017-01-05 | Discharge: 2017-01-05 | Disposition: A | Discharge status: Home / Self Care | Payer: Self-pay | Attending: Family Medicine | Admitting: Family Medicine

## 2017-01-05 DIAGNOSIS — M545 Low back pain: Secondary | ICD-10-CM

## 2017-01-05 DIAGNOSIS — S39012A Strain of muscle, fascia and tendon of lower back, initial encounter: Secondary | ICD-10-CM

## 2017-01-05 MED ORDER — KETOROLAC TROMETHAMINE 60 MG/2ML IM SOLN
INTRAMUSCULAR | Status: AC
  Filled 2017-01-05: qty 2

## 2017-01-05 MED ORDER — KETOROLAC TROMETHAMINE 60 MG/2ML IM SOLN
60.0000 mg | Freq: Once | INTRAMUSCULAR | Status: AC
  Administered 2017-01-05: 60 mg via INTRAMUSCULAR

## 2017-01-05 MED ORDER — CYCLOBENZAPRINE HCL 10 MG PO TABS: 10.0000 mg | ORAL_TABLET | Freq: Two times a day (BID) | ORAL | 0 refills | Status: DC | PRN

## 2017-01-05 MED ORDER — NAPROXEN 500 MG PO TABS: 500.0000 mg | ORAL_TABLET | Freq: Two times a day (BID) | ORAL | 0 refills | Status: DC
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
## 2017-02-27 ENCOUNTER — Encounter (HOSPITAL_BASED_OUTPATIENT_CLINIC_OR_DEPARTMENT_OTHER): Payer: Self-pay | Admitting: Emergency Medicine

## 2017-02-27 ENCOUNTER — Emergency Department (HOSPITAL_BASED_OUTPATIENT_CLINIC_OR_DEPARTMENT_OTHER)
Admission: EM | Admit: 2017-02-27 | Discharge: 2017-02-27 | Disposition: A | Discharge status: Home / Self Care | Payer: BC Managed Care – PPO | Attending: Emergency Medicine | Admitting: Emergency Medicine

## 2017-02-27 DIAGNOSIS — M545 Low back pain, unspecified: Secondary | ICD-10-CM

## 2017-02-27 DIAGNOSIS — Z87891 Personal history of nicotine dependence: Secondary | ICD-10-CM | POA: Insufficient documentation

## 2017-02-27 LAB — URINALYSIS, ROUTINE W REFLEX MICROSCOPIC
BILIRUBIN URINE: NEGATIVE
GLUCOSE, UA: NEGATIVE mg/dL
HGB URINE DIPSTICK: NEGATIVE
KETONES UR: NEGATIVE mg/dL
Leukocytes, UA: NEGATIVE
Nitrite: NEGATIVE
PH: 7 (ref 5.0–8.0)
Protein, ur: NEGATIVE mg/dL
Specific Gravity, Urine: 1.021 (ref 1.005–1.030)

## 2017-02-27 MED ORDER — CYCLOBENZAPRINE HCL 10 MG PO TABS: 10.0000 mg | ORAL_TABLET | Freq: Three times a day (TID) | ORAL | 0 refills | Status: DC | PRN

## 2017-02-27 MED FILL — CYCLOBENZAPRINE 10 MG TAB: 10 | 5 days supply | Qty: 15 | Fill #0

## 2017-02-27 NOTE — Discharge Instructions (Signed)

## 2017-02-27 NOTE — ED Triage Notes (Signed)
Patient states that he has had pain to his right side for about 2 weeks. Reports that it hurts worse with movement. The patient states that this started after a stomach bug.

## 2017-02-27 NOTE — ED Provider Notes (Signed)
[facility]   CSN: [id number] Arrival date & time: 02/27/17  [id number]     History   Chief Complaint Chief Complaint  Patient presents with  . Flank Pain    HPI Brian Harding is a 31 y.o. male with chief complaint slow onset, progressively worsening right-sided low back pain for 2 weeks. Patient states that his pain began 2 weeks ago at around the time that he experienced a "stomach bug" where his primary symptoms were nausea and diarrhea. Patient states that the symptoms have since resolved, but he has ongoing right-sided low back pain that is intermittent. He states he has no pain at rest, but standing, walking, rotation elicits this pain which is dull and achey. He denies trauma or falls. He denies numbness, tingling, weakness, bowel or bladder incontinence. He states he has tried Tylenol PM which was helpful for sleep. He has not tried anything else.    He denies fever, chills, chest pain, short of breath, abdominal pain, nausea, vomiting, diarrhea, dysuria, hematuria, melena.  The history is provided by the patient.    Past Medical History:  Diagnosis Date  . Allergy   . Prediabetes     Patient Active Problem List   Diagnosis Date Noted  . Obesity, unspecified 03/17/2014    History reviewed. No pertinent surgical history.     Home Medications    Prior to Admission medications   Medication Sig Start Date End Date Taking? Authorizing Provider  acetaminophen (TYLENOL) 500 MG tablet Take 1,000 mg by mouth every 6 (six) hours as needed for moderate pain.    [provider]  amoxicillin-clavulanate (AUGMENTIN) 875-125 MG tablet Take 1 tablet by mouth 2 (two) times daily. Patient not taking: Reported on 09/29/2016 08/04/15   Carmelina DaneAnderson, Jeffery S, MD  cyclobenzaprine (FLEXERIL) 10 MG tablet Take 1 tablet (10 mg total) by mouth 3 (three) times daily as needed for muscle spasms. 02/27/17   Micael Barb A, PA-C  diclofenac (VOLTAREN) 50 MG EC  tablet Take 1 tablet (50 mg total) by mouth 2 (two) times daily. Patient not taking: Reported on 09/29/2016 08/04/15   Carmelina DaneAnderson, Jeffery S, MD  naproxen (NAPROSYN) 500 MG tablet Take 1 tablet (500 mg total) by mouth 2 (two) times daily with a meal. 01/05/17   Deatra Canterxford, William J, FNP  predniSONE (DELTASONE) 20 MG tablet Take 2 tablets (40 mg total) by mouth daily with breakfast. For the next four days 09/29/16   Gerhard MunchLockwood, Robert, MD  traMADol (ULTRAM) 50 MG tablet Take 2 tablets (100 mg total) by mouth every 6 (six) hours as needed. Patient not taking: Reported on 09/29/2016 08/04/15   Carmelina DaneAnderson, Jeffery S, MD    Family History Family History  Problem Relation Age of Onset  . Diabetes Mother   . Hypertension Other     Social History Social History  Substance Use Topics  . Smoking status: Former Games developermoker  . Smokeless tobacco: Current User     Comment: E cigs/ Quit 11/2014   . Alcohol use No     Comment: former      Allergies   Patient has no known allergies.   Review of Systems Review of Systems  Constitutional: Negative for chills and fever.  Respiratory: Negative for shortness of breath.   Cardiovascular: Negative for chest pain.  Gastrointestinal: Negative for abdominal pain, blood in stool, diarrhea, nausea and vomiting.  Genitourinary: Negative for dysuria and hematuria.  Musculoskeletal: Positive for back pain. Negative for neck pain.  Neurological:  Negative for weakness, numbness and headaches.  All other systems reviewed and are negative.    Physical Exam Updated Vital Signs BP 112/64 (BP Location: Right Arm)   Pulse 68   Temp 99.3 F (37.4 C) (Oral)   Resp 16   Ht 5\' 7"  (1.702 m)   Wt (!) 152.4 kg   SpO2 97%   BMI 52.63 kg/m   Physical Exam  Constitutional: He is oriented to person, place, and time. He appears well-developed and well-nourished. No distress.  HENT:  Head: Normocephalic and atraumatic.  Neck: Normal range of motion. Neck supple. No JVD  present. No tracheal deviation present.  Cardiovascular: Normal rate, regular rhythm, normal heart sounds and intact distal pulses.   2+ radial and DP/PT pulses bl, negative Homan's bl   Pulmonary/Chest: Effort normal and breath sounds normal.  Abdominal: Soft. Bowel sounds are normal. He exhibits no distension. There is no tenderness.  Genitourinary:  Genitourinary Comments: No CVA tenderness  Musculoskeletal: Normal range of motion. He exhibits tenderness.  No midline spine TTP. Mild right-sided paraspinal tenderness while standing. Full range of motion. Pain on the right side with left lateral rotation. Negative straight leg raise, but "pulling sensation" right lower back with lifting of right leg. 5/5 strength of BLE  Neurological: He is alert and oriented to person, place, and time. No cranial nerve deficit or sensory deficit.  Fluent speech, no facial droop, sensation intact globally, normal gait, and patient able to heel walk and toe walk without difficulty.   Skin: Skin is warm and dry. Capillary refill takes less than 2 seconds. He is not diaphoretic.     ED Treatments / Results  Labs (all labs ordered are listed, but only abnormal results are displayed) Labs Reviewed  URINALYSIS, ROUTINE W REFLEX MICROSCOPIC    EKG  EKG Interpretation None       Radiology No results found.  Procedures Procedures (including critical care time)  Medications Ordered in ED Medications - No data to display   Initial Impression / Assessment and Plan / ED Course  I have reviewed the triage vital signs and the nursing notes.  Pertinent labs & imaging results that were available during my care of the patient were reviewed by me and considered in my medical decision making (see chart for details).     Patient with right-sided low back pain, reproducible on lateral rotation. Afebrile, vital signs are stable. UA not concerning for UTI, nephrolithiasis, pyelonephritis. Low suspicion for  diverticulitis, AAA, CES.  No neurological deficits and normal neuro exam.  Patient can ambulate without difficulty.  No loss of bowel or bladder control. RICE protocol and NSAIDs, Tylenol, heat, ice indicated and discussed with patient. Patient has orthopedist, and states he will follow up with this physician for reevaluation. Discussed strict ED return precautions. Pt verbalized understanding of and agreement with plan and is safe for discharge home at this time.   Final Clinical Impressions(s) / ED Diagnoses   Final diagnoses:  Acute right-sided low back pain without sciatica    New Prescriptions Discharge Medication List as of 02/27/2017 11:18 AM       Jeanie Sewer, PA-C 02/27/17 2038    Geoffery Lyons, MD 03/06/17 [phone]

## 2017-05-03 ENCOUNTER — Emergency Department (HOSPITAL_BASED_OUTPATIENT_CLINIC_OR_DEPARTMENT_OTHER)
Admission: EM | Admit: 2017-05-03 | Discharge: 2017-05-03 | Disposition: A | Discharge status: Home / Self Care | Payer: Self-pay | Attending: Emergency Medicine | Admitting: Emergency Medicine

## 2017-05-03 ENCOUNTER — Encounter (HOSPITAL_BASED_OUTPATIENT_CLINIC_OR_DEPARTMENT_OTHER): Payer: Self-pay | Admitting: *Deleted

## 2017-05-03 ENCOUNTER — Emergency Department (HOSPITAL_BASED_OUTPATIENT_CLINIC_OR_DEPARTMENT_OTHER): Payer: Self-pay

## 2017-05-03 DIAGNOSIS — Z79899 Other long term (current) drug therapy: Secondary | ICD-10-CM | POA: Insufficient documentation

## 2017-05-03 DIAGNOSIS — J209 Acute bronchitis, unspecified: Secondary | ICD-10-CM | POA: Insufficient documentation

## 2017-05-03 DIAGNOSIS — M79672 Pain in left foot: Secondary | ICD-10-CM | POA: Insufficient documentation

## 2017-05-03 DIAGNOSIS — J40 Bronchitis, not specified as acute or chronic: Secondary | ICD-10-CM

## 2017-05-03 DIAGNOSIS — Z87891 Personal history of nicotine dependence: Secondary | ICD-10-CM | POA: Insufficient documentation

## 2017-05-03 LAB — CBG MONITORING, ED: GLUCOSE-CAPILLARY: 102 mg/dL — AB (ref 65–99)

## 2017-05-03 MED ORDER — AZITHROMYCIN 250 MG PO TABS: ORAL_TABLET | ORAL | 0 refills | Status: AC

## 2017-05-03 MED FILL — AZITHROMYCIN 250 MG TABLET: 250 | 5 days supply | Qty: 6 | Fill #0

## 2017-05-03 NOTE — ED Triage Notes (Signed)
Pt reports left foot pain x 2 months, denies injury or trauma, states he works on his feet all day. Pt also reports "a cold" for 2 weeks, "passing it back and forth with my wife" with cough and sinus congestion, this am had some pink sputum.

## 2017-05-03 NOTE — ED Provider Notes (Signed)
[facility] Provider Note   CSN: [id number] Arrival date & time: 05/03/17  [id number]     History   Chief Complaint Chief Complaint  Patient presents with  . Cough  . Foot Pain    HPI Brian Harding is a 31 y.o. male with no significant past medical history who presents with cough and congestion for the past month. Patient reports that this morning he coughed out some blood. Patient reports muffled wound in his right ear. Patient endorses some congestion and rhinorrhea with his symptoms for the past month. Patient reports "low grade fever" at home around 99.61F but denies chills shortness of breath, abdominal pain. Patient also reports some left foot pain, starting around his heel and wrapping around the top of his foot. Patient has tried ibuprofen prescribed by PCP and Robaxin with no relief in pain. Foot pain is worst at night. Patient has also tried ice with no change. Patient is on his feet all day for work.  HPI  Past Medical History:  Diagnosis Date  . Allergy   . Prediabetes     Patient Active Problem List   Diagnosis Date Noted  . Obesity, unspecified 03/17/2014    History reviewed. No pertinent surgical history.     Home Medications    Prior to Admission medications   Medication Sig Start Date End Date Taking? Authorizing Provider  acetaminophen (TYLENOL) 500 MG tablet Take 1,000 mg by mouth every 6 (six) hours as needed for moderate pain.    [provider]  amoxicillin-clavulanate (AUGMENTIN) 875-125 MG tablet Take 1 tablet by mouth 2 (two) times daily. Patient not taking: Reported on 09/29/2016 08/04/15   Carmelina DaneAnderson, Jeffery S, MD  azithromycin [facility](ZITHROMAX) 250 MG tablet Take 2 tabs by mouth on day 1, then 1 tab by mouth daily 05/03/17 05/09/17  Brighton Pilley, Lilia ArgueAbdoulaye, MD  cyclobenzaprine (FLEXERIL) 10 MG tablet Take 1 tablet (10 mg total) by mouth 3 (three) times daily as needed for muscle spasms. 02/27/17   Fawze, Mina A, PA-C  diclofenac (VOLTAREN) 50  MG EC tablet Take 1 tablet (50 mg total) by mouth 2 (two) times daily. Patient not taking: Reported on 09/29/2016 08/04/15   Carmelina DaneAnderson, Jeffery S, MD  naproxen (NAPROSYN) 500 MG tablet Take 1 tablet (500 mg total) by mouth 2 (two) times daily with a meal. 01/05/17   Deatra Canterxford, William J, FNP  predniSONE (DELTASONE) 20 MG tablet Take 2 tablets (40 mg total) by mouth daily with breakfast. For the next four days 09/29/16   Gerhard MunchLockwood, Robert, MD  traMADol (ULTRAM) 50 MG tablet Take 2 tablets (100 mg total) by mouth every 6 (six) hours as needed. Patient not taking: Reported on 09/29/2016 08/04/15   Carmelina DaneAnderson, Jeffery S, MD    Family History Family History  Problem Relation Age of Onset  . Diabetes Mother   . Hypertension Other     Social History Social History  Substance Use Topics  . Smoking status: Former Games developermoker  . Smokeless tobacco: Current User     Comment: E cigs/ Quit 11/2014   . Alcohol use No     Comment: former      Allergies   Patient has no known allergies.   Review of Systems Review of Systems  Constitutional: Positive for fatigue.  HENT: Positive for congestion, ear pain, hearing loss and rhinorrhea.   Eyes: Negative.   Respiratory: Positive for cough.   Cardiovascular: Negative.   Gastrointestinal: Negative.   Endocrine: Negative.   Genitourinary: Negative.  Musculoskeletal: Positive for myalgias.  Allergic/Immunologic: Negative.   Neurological: Negative.   Hematological: Negative.   Psychiatric/Behavioral: Negative.      Physical Exam Updated Vital Signs BP 134/80 (BP Location: Left Arm)   Pulse 79   Temp 98.5 F (36.9 C) (Oral)   Resp 18   Ht 5\' 6"  (1.676 m)   Wt (!) 154.2 kg (340 lb)   SpO2 97%   BMI 54.88 kg/m   Physical Exam  Constitutional: He is oriented to person, place, and time. He appears well-developed.  HENT:  Head: Normocephalic and atraumatic.  Right Ear: Tympanic membrane is injected.  Left Ear: A middle ear effusion is present.    Eyes: Pupils are equal, round, and reactive to light. EOM are normal.  Neck: Normal range of motion. Neck supple.  Cardiovascular: Normal rate, regular rhythm and normal heart sounds.   Pulmonary/Chest: Effort normal and breath sounds normal.  Abdominal: Soft. Bowel sounds are normal.  Musculoskeletal: Normal range of motion.  Left plantar tenderness with palpation of the calcaneus  Neurological: He is alert and oriented to person, place, and time.  Skin: Skin is warm and dry.  Psychiatric: He has a normal mood and affect. His behavior is normal.     ED Treatments / Results  Labs (all labs ordered are listed, but only abnormal results are displayed) Labs Reviewed  CBG MONITORING, ED - Abnormal; Notable for the following:       Result Value   Glucose-Capillary 102 (*)    All other components within normal limits    EKG  EKG Interpretation None       Radiology Dg Chest 2 View  Result Date: 05/03/2017 CLINICAL DATA:  Cough and congestion for 2 weeks. EXAM: CHEST  2 VIEW COMPARISON:  04/04/2015; 06/17/2014 FINDINGS: Examination is degraded due to patient body habitus. Grossly unchanged cardiac silhouette and mediastinal contours. Mild diffuse thickening of the pulmonary interstitium. No focal parenchymal opacities. No pleural effusion or pneumothorax. No evidence of edema. No acute osseus abnormalities. Unchanged mild (approximately 25%) compression deformity involving a lower thoracic/upper lumbar vertebral body with associated focal kyphosis at this location. IMPRESSION: Findings suggestive of airways disease / bronchitis. No focal airspace opacities to suggest pneumonia. Electronically Signed   By: Simonne Come M.D.   On: 05/03/2017 09:54   Dg Foot Complete Left  Result Date: 05/03/2017 CLINICAL DATA:  Heel, arch and anterior foot pain since 03/17/2017. No known injury. EXAM: LEFT FOOT - COMPLETE 3+ VIEW COMPARISON:  None. FINDINGS: No fracture or dislocation. Joint spaces are  preserved. No erosions. No significant hallux valgus deformity. Note is made of an os tibialis externum. Moderate-sized plantar calcaneal spur. Minimal enthesopathic change involving the Achilles tendon insertion site. Regional soft tissues appear normal. IMPRESSION: Moderate sized plantar calcaneal spur and minimal enthesopathic change involving the Achilles tendon insertion site. Otherwise, no explanation for patient's foot and heel pain. Electronically Signed   By: Simonne Come M.D.   On: 05/03/2017 09:52    Procedures Procedures (including critical care time)  Medications Ordered in ED Medications - No data to display   Initial Impression / Assessment and Plan / ED Course  Patient is 31 yo old who present wih cough, congestion, ear pain and left pain. CXR showed evidence of bronchitis/ airway disease. Ear exam shows mildly inflamed TM in right ear and minimal effusion in left ear. Given duration of symptoms and patient presentation on admission, will discharge on Z-pack with close follow up with PCP.  Left foot Xray shows left calcaneal spur likely causing pain described by patient. Discussed alternating ibuprofen and tylenol for pain and inflammation with follow up with podiatry for further care. Patient with good understanding and in agreement with plan.  I have reviewed the triage vital signs and the nursing notes.  Pertinent labs & imaging results that were available during my care of the patient were reviewed by me and considered in my medical decision making (see chart for details).     Final Clinical Impressions(s) / ED Diagnoses   Final diagnoses:  Bronchitis  Foot pain, left    New Prescriptions New Prescriptions   AZITHROMYCIN (ZITHROMAX) 250 MG TABLET    Take 2 tabs by mouth on day 1, then 1 tab by mouth daily     Lovena Neighbours, MD 05/03/17 1027    Maia Plan, MD 05/03/17 1032

## 2017-05-22 ENCOUNTER — Encounter: Payer: BC Managed Care – PPO | Admitting: Podiatry

## 2017-07-13 ENCOUNTER — Emergency Department (HOSPITAL_BASED_OUTPATIENT_CLINIC_OR_DEPARTMENT_OTHER)
Admission: EM | Admit: 2017-07-13 | Discharge: 2017-07-13 | Disposition: A | Discharge status: Home / Self Care | Payer: BC Managed Care – PPO | Attending: Emergency Medicine | Admitting: Emergency Medicine

## 2017-07-13 ENCOUNTER — Encounter (HOSPITAL_BASED_OUTPATIENT_CLINIC_OR_DEPARTMENT_OTHER): Payer: Self-pay | Admitting: *Deleted

## 2017-07-13 DIAGNOSIS — L989 Disorder of the skin and subcutaneous tissue, unspecified: Secondary | ICD-10-CM | POA: Insufficient documentation

## 2017-07-13 DIAGNOSIS — J00 Acute nasopharyngitis [common cold]: Secondary | ICD-10-CM | POA: Insufficient documentation

## 2017-07-13 DIAGNOSIS — M545 Low back pain: Secondary | ICD-10-CM | POA: Insufficient documentation

## 2017-07-13 DIAGNOSIS — Z87891 Personal history of nicotine dependence: Secondary | ICD-10-CM | POA: Insufficient documentation

## 2017-07-13 DIAGNOSIS — R05 Cough: Secondary | ICD-10-CM

## 2017-07-13 DIAGNOSIS — R059 Cough, unspecified: Secondary | ICD-10-CM

## 2017-07-13 DIAGNOSIS — G8929 Other chronic pain: Secondary | ICD-10-CM | POA: Insufficient documentation

## 2017-07-13 MED ORDER — ACETAMINOPHEN 500 MG PO TABS: 1000.0000 mg | ORAL_TABLET | Freq: Three times a day (TID) | ORAL | 0 refills | Status: AC

## 2017-07-13 NOTE — Discharge Instructions (Signed)
You may use over-the-counter Motrin (Ibuprofen), Acetaminophen (Tylenol), topical muscle creams such as SalonPas, Icy Hot, Bengay, etc. Please stretch, apply heat, and have massage therapy for additional assistance. ° °
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
## 2017-07-13 NOTE — ED Triage Notes (Signed)
Pt reports back pain for over a week, this am started having cough.

## 2017-07-17 NOTE — Progress Notes (Signed)
This encounter was created in error - please disregard.

## 2017-09-26 ENCOUNTER — Emergency Department (HOSPITAL_BASED_OUTPATIENT_CLINIC_OR_DEPARTMENT_OTHER)
Admission: EM | Admit: 2017-09-26 | Discharge: 2017-09-26 | Disposition: A | Discharge status: Home / Self Care | Payer: Self-pay | Attending: Emergency Medicine | Admitting: Emergency Medicine

## 2017-09-26 ENCOUNTER — Other Ambulatory Visit: Payer: Self-pay

## 2017-09-26 ENCOUNTER — Emergency Department (HOSPITAL_BASED_OUTPATIENT_CLINIC_OR_DEPARTMENT_OTHER): Payer: Self-pay

## 2017-09-26 ENCOUNTER — Encounter (HOSPITAL_BASED_OUTPATIENT_CLINIC_OR_DEPARTMENT_OTHER): Payer: Self-pay | Admitting: *Deleted

## 2017-09-26 DIAGNOSIS — M25512 Pain in left shoulder: Secondary | ICD-10-CM | POA: Insufficient documentation

## 2017-09-26 DIAGNOSIS — Z87891 Personal history of nicotine dependence: Secondary | ICD-10-CM | POA: Insufficient documentation

## 2017-09-26 MED ORDER — NAPROXEN 500 MG PO TABS: 500.0000 mg | ORAL_TABLET | Freq: Two times a day (BID) | ORAL | 0 refills | Status: DC

## 2017-09-26 NOTE — ED Provider Notes (Signed)
[facility] EMERGENCY DEPARTMENT Provider Note   CSN: [id number] Arrival date & time: 09/26/17  1342     History   Chief Complaint Chief Complaint  Patient presents with  . Arm Pain    HPI Brian Harding is a 31 y.o. male.  Patient with 6 days of left shoulder pain. No known injury.    The history is provided by the patient. No language interpreter was used.  Shoulder Pain   This is a new problem. The current episode started more than 2 days ago. The problem has been gradually worsening. The pain is present in the left shoulder. The quality of the pain is described as aching and sharp. The pain is moderate. Associated symptoms include limited range of motion and stiffness. There has been no history of extremity trauma.    Past Medical History:  Diagnosis Date  . Allergy   . Prediabetes     Patient Active Problem List   Diagnosis Date Noted  . Obesity, unspecified 03/17/2014    History reviewed. No pertinent surgical history.     Home Medications    Prior to Admission medications   Not on File    Family History Family History  Problem Relation Age of Onset  . Diabetes Mother   . Hypertension Other     Social History Social History   Tobacco Use  . Smoking status: Former Games developermoker  . Smokeless tobacco: Current User  . Tobacco comment: E cigs/ Quit 11/2014   Substance Use Topics  . Alcohol use: No    Alcohol/week: 0.0 oz    Comment: former   . Drug use: No     Allergies   Patient has no known allergies.   Review of Systems Review of Systems  Musculoskeletal: Positive for arthralgias and stiffness.  All other systems reviewed and are negative.    Physical Exam Updated Vital Signs BP 129/74 (BP Location: Left Arm)   Pulse 90   Temp 98.2 F (36.8 C)   Resp 16   Ht 5\' 7"  (1.702 m)   Wt (!) 156.5 kg (345 lb)   SpO2 98%   BMI 54.03 kg/m   Physical Exam  Constitutional: He is oriented to person, place, and time. He appears  well-developed and well-nourished.  HENT:  Head: Atraumatic.  Eyes: Conjunctivae are normal.  Neck: Neck supple.  Cardiovascular: Normal rate and regular rhythm.  Pulmonary/Chest: Effort normal and breath sounds normal.  Abdominal: Soft. Bowel sounds are normal.  Musculoskeletal: He exhibits tenderness. He exhibits no deformity.       Left shoulder: He exhibits decreased range of motion, crepitus and pain. He exhibits no deformity, normal pulse and normal strength.  Lymphadenopathy:    He has no cervical adenopathy.  Neurological: He is alert and oriented to person, place, and time.  Skin: Skin is warm and dry.  Nursing note and vitals reviewed.    ED Treatments / Results  Labs (all labs ordered are listed, but only abnormal results are displayed) Labs Reviewed - No data to display  EKG  EKG Interpretation None       Radiology Dg Shoulder Left  Result Date: 09/26/2017 CLINICAL DATA:  Patient fell 6 days ago injuring the left shoulder. Presents with pain and head of humerus with limited range of motion. EXAM: LEFT SHOULDER - 2+ VIEW COMPARISON:  None. FINDINGS: There is no evidence of fracture or dislocation. There is no evidence of arthropathy or other focal bone abnormality. Soft tissues are unremarkable.  The included ribs and lung are non acute. [facility] joint is maintained without dislocation. The glenohumeral joint appears intact. IMPRESSION: No acute fracture or malalignment of the left shoulder. Intact AC and glenohumeral joints. Electronically Signed   By: Tollie Ethavid  Kwon M.D.   On: 09/26/2017 17:10    Procedures Procedures (including critical care time)  Medications Ordered in ED Medications - No data to display   Initial Impression / Assessment and Plan / ED Course  I have reviewed the triage vital signs and the nursing notes.  Pertinent labs & imaging results that were available during my care of the patient were reviewed by me and considered in my medical decision  making (see chart for details).     Patient X-Ray negative for obvious fracture or dislocation.  Pt advised to follow up with orthopedics. Patient given arm sling while in ED, conservative therapy recommended and discussed. Patient will be discharged home & is agreeable with above plan. Returns precautions discussed. Pt appears safe for discharge.  Final Clinical Impressions(s) / ED Diagnoses   Final diagnoses:  Acute pain of left shoulder    ED Discharge Orders        Ordered    naproxen (NAPROSYN) 500 MG tablet  2 times daily     09/26/17 1741       Felicie MornSmith, Kathlynn Swofford, NP 09/26/17 1745    Gwyneth SproutPlunkett, Whitney, MD 09/27/17 0100

## 2017-09-26 NOTE — ED Triage Notes (Signed)
Pt c/o left shoulder and arm pain sharp throbbing pain and burning x 1 week, denies injury

## 2017-09-26 NOTE — ED Notes (Signed)
ED Provider at bedside. 

## 2017-11-28 ENCOUNTER — Emergency Department
Admission: EM | Admit: 2017-11-28 | Discharge: 2017-11-28 | Disposition: A | Discharge status: Home / Self Care | Payer: Self-pay | Attending: Emergency Medicine | Admitting: Emergency Medicine

## 2017-11-28 ENCOUNTER — Encounter: Payer: Self-pay | Admitting: Emergency Medicine

## 2017-11-28 ENCOUNTER — Emergency Department: Payer: Self-pay

## 2017-11-28 ENCOUNTER — Other Ambulatory Visit: Payer: Self-pay

## 2017-11-28 DIAGNOSIS — M25512 Pain in left shoulder: Secondary | ICD-10-CM | POA: Insufficient documentation

## 2017-11-28 DIAGNOSIS — B9789 Other viral agents as the cause of diseases classified elsewhere: Secondary | ICD-10-CM

## 2017-11-28 DIAGNOSIS — Z87891 Personal history of nicotine dependence: Secondary | ICD-10-CM | POA: Insufficient documentation

## 2017-11-28 DIAGNOSIS — J069 Acute upper respiratory infection, unspecified: Secondary | ICD-10-CM | POA: Insufficient documentation

## 2017-11-28 HISTORY — DX: Other intervertebral disc degeneration, lumbar region: M51.36

## 2017-11-28 MED ORDER — PSEUDOEPH-BROMPHEN-DM 30-2-10 MG/5ML PO SYRP: 5.0000 mL | ORAL_SOLUTION | Freq: Four times a day (QID) | ORAL | 0 refills | Status: DC | PRN

## 2017-11-28 NOTE — ED Notes (Signed)
See triage note  States he is having left shoulder pain for the past 2 weeks   W/o injury  Also now having prod cough ,occasionally  Afebrile on arrival

## 2017-11-28 NOTE — ED Triage Notes (Signed)
Pt in via POV with complaints of productive cough x 2 days.  Pt also complains of left shoulder pain, recently seen for same, given shoulder sling but without any relief.  Vitals WDL, NAD noted at this time.

## 2017-11-28 NOTE — ED Provider Notes (Signed)
[facility] Emergency Department Provider Note   ____________________________________________   First MD Initiated Contact with Patient 11/28/17 1501     (approximate)  I have reviewed the triage vital signs and the nursing notes.   HISTORY  Chief Complaint Cough    HPI Brian Harding is a 32 y.o. male patient complaining of productive cough for 2 days.  Patient also complained of left shoulder pain but has not follow-up with orthopedic as directed.  Patient denies fever but state he has chills.  Patient denies nausea, vomiting, diarrhea.  No palliative measures for complaint.   Past Medical History:  Diagnosis Date  . Allergy   . Degenerative disc disease, lumbar   . Prediabetes     Patient Active Problem List   Diagnosis Date Noted  . Obesity, unspecified 03/17/2014    History reviewed. No pertinent surgical history.  Prior to Admission medications   Medication Sig Start Date End Date Taking? Authorizing Provider  brompheniramine-pseudoephedrine-DM 30-2-10 MG/5ML syrup Take 5 mLs by mouth 4 (four) times daily as needed. 11/28/17   Joni Reining, PA-C  naproxen (NAPROSYN) 500 MG tablet Take 1 tablet (500 mg total) by mouth 2 (two) times daily. 09/26/17   Felicie Morn, NP    Allergies Patient has no known allergies.  Family History  Problem Relation Age of Onset  . Diabetes Mother   . Hypertension Other     Social History Social History   Tobacco Use  . Smoking status: Former Smoker    Types: Cigarettes  . Smokeless tobacco: Current User  . Tobacco comment: E cigs/ Quit 11/2014   Substance Use Topics  . Alcohol use: Yes    Alcohol/week: 0.0 oz    Comment: occassional  . Drug use: No    Review of Systems Constitutional: No fever/chills Eyes: No visual changes. ENT: No sore throat. Cardiovascular: Denies chest pain. Respiratory: Denies shortness of breath.  Chest congestion Gastrointestinal: No abdominal pain.  No nausea, no  vomiting.  No diarrhea.  No constipation. Genitourinary: Negative for dysuria. Musculoskeletal: Left shoulder pain. Skin: Negative for rash. Neurological: Negative for headaches, focal weakness or numbness.   ____________________________________________   PHYSICAL EXAM:  VITAL SIGNS: ED Triage Vitals  Enc Vitals Group     BP 11/28/17 1434 (!) 153/81     Pulse Rate 11/28/17 1434 65     Resp 11/28/17 1434 20     Temp 11/28/17 1434 97.8 F (36.6 C)     Temp Source 11/28/17 1434 Oral     SpO2 11/28/17 1434 94 %     Weight 11/28/17 1435 (!) 335 lb (152 kg)     Height 11/28/17 1435 5\' 7"  (1.702 m)     Head Circumference --      Peak Flow --      Pain Score 11/28/17 1434 7     Pain Loc --      Pain Edu? --      Excl. in [place]? --    Constitutional: Alert and oriented. Well appearing and in no acute distress. Neck: No stridor.  No cervical spine tenderness to palpation. Hematological/Lymphatic/Immunilogical: No cervical lymphadenopathy. Cardiovascular: Normal rate, regular rhythm. Grossly normal heart sounds.  Good peripheral circulation.  Blood pressure Respiratory: Normal respiratory effort.  No retractions. Lungs CTAB.  tenderness. Musculoskeletal: No lower extremity tenderness nor edema.  No joint effusions. Neurologic:  Normal speech and language. No gross focal neurologic deficits are appreciated. No gait instability. Skin:  Skin is warm,  dry and intact. No rash noted.  ____________________________________________   LABS (all labs ordered are listed, but only abnormal results are displayed)  Labs Reviewed - No data to display ____________________________________________  EKG   ____________________________________________  RADIOLOGY  ED MD interpretation: No acute findings on x-ray of the chest.  Official radiology report(s): Dg Chest 2 View  Result Date: 11/28/2017 CLINICAL DATA:  Cough and shortness of Breath EXAM: CHEST  2 VIEW COMPARISON:  None. FINDINGS:  The heart size and mediastinal contours are within normal limits. Both lungs are clear. The visualized skeletal structures are unremarkable. IMPRESSION: No active cardiopulmonary disease. Electronically Signed   By: Alcide CleverMark  Lukens M.D.   On: 11/28/2017 15:39    ____________________________________________   PROCEDURES  Procedure(s) performed: None  Procedures  Critical Care performed: No  ____________________________________________   INITIAL IMPRESSION / ASSESSMENT AND PLAN / ED COURSE  As part of my medical decision making, I reviewed the following data within the electronic MEDICAL RECORD NUMBER    Productive cough for 2 days secondary to viral respiratory infection.  Discussed negative chest x-ray findings with patient.  Patient given discharge care instruction.  Patient given return to work note and advised take medication as directed.  Patient advised to follow-up PCP if no improvement in 2-3 days.      ____________________________________________   FINAL CLINICAL IMPRESSION(S) / ED DIAGNOSES  Final diagnoses:  Viral URI with cough     ED Discharge Orders        Ordered    brompheniramine-pseudoephedrine-DM 30-2-10 MG/5ML syrup  4 times daily PRN     11/28/17 1545       Note:  This document was prepared using Dragon voice recognition software and may include unintentional dictation errors.    Joni ReiningSmith, Gunner Iodice K, PA-C 11/28/17 1548    Jene EveryKinner, Robert, MD 12/04/17 1153

## 2017-11-29 ENCOUNTER — Emergency Department
Admission: EM | Admit: 2017-11-29 | Discharge: 2017-11-29 | Disposition: A | Discharge status: Home / Self Care | Payer: Self-pay | Attending: Emergency Medicine | Admitting: Emergency Medicine

## 2017-11-29 ENCOUNTER — Other Ambulatory Visit: Payer: Self-pay

## 2017-11-29 DIAGNOSIS — J111 Influenza due to unidentified influenza virus with other respiratory manifestations: Secondary | ICD-10-CM | POA: Insufficient documentation

## 2017-11-29 DIAGNOSIS — R197 Diarrhea, unspecified: Secondary | ICD-10-CM | POA: Insufficient documentation

## 2017-11-29 DIAGNOSIS — Z87891 Personal history of nicotine dependence: Secondary | ICD-10-CM | POA: Insufficient documentation

## 2017-11-29 DIAGNOSIS — Z79899 Other long term (current) drug therapy: Secondary | ICD-10-CM | POA: Insufficient documentation

## 2017-11-29 DIAGNOSIS — R112 Nausea with vomiting, unspecified: Secondary | ICD-10-CM | POA: Insufficient documentation

## 2017-11-29 DIAGNOSIS — R6889 Other general symptoms and signs: Secondary | ICD-10-CM

## 2017-11-29 LAB — CBC
HEMATOCRIT: 40.5 % (ref 40.0–52.0)
Hemoglobin: 13.8 g/dL (ref 13.0–18.0)
MCH: 27.6 pg (ref 26.0–34.0)
MCHC: 34 g/dL (ref 32.0–36.0)
MCV: 81.3 fL (ref 80.0–100.0)
Platelets: 278 10*3/uL (ref 150–440)
RBC: 4.98 MIL/uL (ref 4.40–5.90)
RDW: 14.2 % (ref 11.5–14.5)
WBC: 7.3 10*3/uL (ref 3.8–10.6)

## 2017-11-29 LAB — COMPREHENSIVE METABOLIC PANEL
ALBUMIN: 3.6 g/dL (ref 3.5–5.0)
ALK PHOS: 111 U/L (ref 38–126)
ALT: 22 U/L (ref 17–63)
AST: 20 U/L (ref 15–41)
Anion gap: 8 (ref 5–15)
BILIRUBIN TOTAL: 0.6 mg/dL (ref 0.3–1.2)
BUN: 13 mg/dL (ref 6–20)
CALCIUM: 9 mg/dL (ref 8.9–10.3)
CO2: 25 mmol/L (ref 22–32)
Chloride: 103 mmol/L (ref 101–111)
Creatinine, Ser: 0.88 mg/dL (ref 0.61–1.24)
GFR calc Af Amer: >60 mL/min (ref >60)
GLUCOSE: 106 mg/dL — AB (ref 65–99)
POTASSIUM: 3.9 mmol/L (ref 3.5–5.1)
Sodium: 136 mmol/L (ref 135–145)
TOTAL PROTEIN: 7.3 g/dL (ref 6.5–8.1)

## 2017-11-29 LAB — URINALYSIS, COMPLETE (UACMP) WITH MICROSCOPIC
BILIRUBIN URINE: NEGATIVE
Bacteria, UA: NONE SEEN
GLUCOSE, UA: NEGATIVE mg/dL
HGB URINE DIPSTICK: NEGATIVE
Ketones, ur: NEGATIVE mg/dL
LEUKOCYTES UA: NEGATIVE
NITRITE: NEGATIVE
PH: 5 (ref 5.0–8.0)
Protein, ur: NEGATIVE mg/dL
SPECIFIC GRAVITY, URINE: 1.012 (ref 1.005–1.030)

## 2017-11-29 LAB — LIPASE, BLOOD: Lipase: 25 U/L (ref 11–51)

## 2017-11-29 MED ORDER — ONDANSETRON 4 MG PO TBDP: 4.0000 mg | ORAL_TABLET | Freq: Three times a day (TID) | ORAL | 0 refills | Status: DC | PRN

## 2017-11-29 MED ORDER — ONDANSETRON 4 MG PO TBDP
4.0000 mg | ORAL_TABLET | Freq: Once | ORAL | Status: AC
  Administered 2017-11-29: 4 mg via ORAL
  Filled 2017-11-29: qty 1

## 2017-11-29 NOTE — ED Notes (Signed)
ED Provider at bedside. 

## 2017-11-29 NOTE — ED Provider Notes (Signed)
[facility] Emergency Department Provider Note  ____________________________________________  Time seen: Approximately 12:58 PM  I have reviewed the triage vital signs and the nursing notes.   HISTORY  Chief Complaint Emesis and Diarrhea   HPI Brian CoppJoshua Harding is a 32 y.o. male who presents for evaluation of flulike symptoms. Patient reports that he started with a productive cough 4 days ago. Cough productive of yellow sputum, constant but improving. Yesterday he started having nausea and vomiting and the diarrhea started today. Had a low-grade fever of 100F at home. No chest pain or shortness of breath, no dizziness, no dysuria hematuria, no abdominal pain. Patient has been urinating normal. Has had 2 episodes of NBNB emesis but still tolerating by mouth. He has no history of asthma COPD. He was seen here yesterday with a chest x-ray that was negative for pneumonia.  Past Medical History:  Diagnosis Date  . Allergy   . Degenerative disc disease, lumbar   . Prediabetes     Patient Active Problem List   Diagnosis Date Noted  . Obesity, unspecified 03/17/2014    History reviewed. No pertinent surgical history.  Prior to Admission medications   Medication Sig Start Date End Date Taking? Authorizing Provider  brompheniramine-pseudoephedrine-DM 30-2-10 MG/5ML syrup Take 5 mLs by mouth 4 (four) times daily as needed. 11/28/17   Joni ReiningSmith, Ronald K, PA-C  naproxen (NAPROSYN) 500 MG tablet Take 1 tablet (500 mg total) by mouth 2 (two) times daily. 09/26/17   Felicie MornSmith, David, NP  ondansetron (ZOFRAN ODT) 4 MG disintegrating tablet Take 1 tablet (4 mg total) by mouth every 8 (eight) hours as needed for nausea or vomiting. 11/29/17   Nita SickleVeronese, [place], MD    Allergies Patient has no known allergies.  Family History  Problem Relation Age of Onset  . Diabetes Mother   . Hypertension Other     Social History Social History   Tobacco Use  . Smoking status: Former  Smoker    Types: Cigarettes  . Smokeless tobacco: Current User  . Tobacco comment: E cigs/ Quit 11/2014   Substance Use Topics  . Alcohol use: Yes    Alcohol/week: 0.0 oz    Comment: occassional  . Drug use: No    Review of Systems  Constitutional: + fever. Eyes: Negative for visual changes. ENT: Negative for sore throat. Neck: No neck pain  Cardiovascular: Negative for chest pain. Respiratory: Negative for shortness of breath. + cough Gastrointestinal: Negative for abdominal pain. + vomiting and diarrhea. Genitourinary: Negative for dysuria. Musculoskeletal: Negative for back pain. Skin: Negative for rash. Neurological: Negative for headaches, weakness or numbness. Psych: No SI or HI  ____________________________________________   PHYSICAL EXAM:  VITAL SIGNS: ED Triage Vitals  Enc Vitals Group     BP 11/29/17 1102 128/67     Pulse Rate 11/29/17 1102 80     Resp 11/29/17 1102 18     Temp 11/29/17 1102 98.3 F (36.8 C)     Temp Source 11/29/17 1102 Oral     SpO2 11/29/17 1102 96 %     Weight 11/29/17 1102 (!) 340 lb (154.2 kg)     Height 11/29/17 1102 5\' 7"  (1.702 m)     Head Circumference --      Peak Flow --      Pain Score 11/29/17 1106 0     Pain Loc --      Pain Edu? --      Excl. in GC? --  Constitutional: Alert and oriented. Well appearing and in no apparent distress. HEENT:      Head: Normocephalic and atraumatic.         Eyes: Conjunctivae are normal. Sclera is non-icteric.       Mouth/Throat: Mucous membranes are moist.       Neck: Supple with no signs of meningismus. Cardiovascular: Regular rate and rhythm. No murmurs, gallops, or rubs. 2+ symmetrical distal pulses are present in all extremities. No JVD. Respiratory: Normal respiratory effort. Lungs are clear to auscultation bilaterally. No wheezes, crackles, or rhonchi.  Gastrointestinal: Soft, non tender, and non distended with positive bowel sounds. No rebound or guarding. Genitourinary: No  CVA tenderness. Musculoskeletal: Nontender with normal range of motion in all extremities. No edema, cyanosis, or erythema of extremities. Neurologic: Normal speech and language. Face is symmetric. Moving all extremities. No gross focal neurologic deficits are appreciated. Skin: Skin is warm, dry and intact. No rash noted. Psychiatric: Mood and affect are normal. Speech and behavior are normal.  ____________________________________________   LABS (all labs ordered are listed, but only abnormal results are displayed)  Labs Reviewed  COMPREHENSIVE METABOLIC PANEL - Abnormal; Notable for the following components:      Result Value   Glucose, Bld 106 (*)    All other components within normal limits  URINALYSIS, COMPLETE (UACMP) WITH MICROSCOPIC - Abnormal; Notable for the following components:   Color, Urine YELLOW (*)    APPearance CLEAR (*)    Squamous Epithelial / LPF 0-5 (*)    All other components within normal limits  LIPASE, BLOOD  CBC   ____________________________________________  EKG  none  ____________________________________________  RADIOLOGY  none  ____________________________________________   PROCEDURES  Procedure(s) performed: None Procedures Critical Care performed:  None ____________________________________________   INITIAL IMPRESSION / ASSESSMENT AND PLAN / ED COURSE   32 y.o. male who presents for evaluation of flulike symptoms x 4 days. Patient is actually well appearing, no distress, normal vital signs, normal work of breathing, lungs are clear, looks well-hydrated. Chest x-ray done yesterday in the emergency room with no evidence of pneumonia. Labs done today are all within normal limits. Patient presentation is concerning for flu or flulike illness. Patient is outside of the window for Tamiflu. Given zofran in the ED and tolerate PO challenge. We'll discharge home on Zofran, and decreased oral hydration, rest, and follow up with primary care  doctor. Discussed and return precautions with patient.      As part of my medical decision making, I reviewed the following data within the electronic MEDICAL RECORD NUMBER Nursing notes reviewed and incorporated, Labs reviewed , Old chart reviewed, Radiograph reviewed , Notes from prior ED visits and [place] Controlled Substance Database    Pertinent labs & imaging results that were available during my care of the patient were reviewed by me and considered in my medical decision making (see chart for details).    ____________________________________________   FINAL CLINICAL IMPRESSION(S) / ED DIAGNOSES  Final diagnoses:  Flu-like symptoms      NEW MEDICATIONS STARTED DURING THIS VISIT:  ED Discharge Orders        Ordered    ondansetron (ZOFRAN ODT) 4 MG disintegrating tablet  Every 8 hours PRN     11/29/17 1257       Note:  This document was prepared using Dragon voice recognition software and may include unintentional dictation errors.    Don Perking, [place], MD 11/29/17 [id number]

## 2017-11-29 NOTE — ED Triage Notes (Addendum)
Pt states he started with cough and congestion over the weekend and then began having N/V/D the past 2 days. Denies any abd pain at present. States he only has the cough now when he lays down at night. PT states he was seen here yesterday with same sx, states he was negative for the flu but his job requires him to take a few days off due to sx.

## 2018-02-22 ENCOUNTER — Telehealth (INDEPENDENT_AMBULATORY_CARE_PROVIDER_SITE_OTHER): Payer: Self-pay | Admitting: Specialist

## 2018-02-22 NOTE — Telephone Encounter (Signed)
I called and lmom that I did pull note from the old system and I have printed it and put it at the front desk for pick up

## 2018-02-22 NOTE — Telephone Encounter (Signed)
Patient called stating that he seen Dr. Otelia Sergeant a couple of years ago and he was taken out of work.  Patient states that he needs another copy of that letter.  CB#[phone].  Thank you.

## 2019-05-20 ENCOUNTER — Other Ambulatory Visit: Payer: Self-pay

## 2019-05-20 DIAGNOSIS — Z20822 Contact with and (suspected) exposure to covid-19: Secondary | ICD-10-CM

## 2019-05-22 LAB — NOVEL CORONAVIRUS, NAA: SARS-CoV-2, NAA: NOT DETECTED

## 2019-09-10 ENCOUNTER — Other Ambulatory Visit: Payer: Self-pay

## 2019-09-10 DIAGNOSIS — Z20822 Contact with and (suspected) exposure to covid-19: Secondary | ICD-10-CM

## 2019-09-12 LAB — NOVEL CORONAVIRUS, NAA: SARS-CoV-2, NAA: NOT DETECTED

## 2020-01-20 ENCOUNTER — Other Ambulatory Visit: Payer: Self-pay

## 2020-01-20 ENCOUNTER — Ambulatory Visit (HOSPITAL_COMMUNITY)
Admission: EM | Admit: 2020-01-20 | Discharge: 2020-01-20 | Disposition: A | Discharge status: Home / Self Care | Payer: No Typology Code available for payment source | Attending: Family Medicine | Admitting: Family Medicine

## 2020-01-20 ENCOUNTER — Encounter (HOSPITAL_COMMUNITY): Payer: Self-pay | Admitting: Emergency Medicine

## 2020-01-20 ENCOUNTER — Ambulatory Visit (INDEPENDENT_AMBULATORY_CARE_PROVIDER_SITE_OTHER): Payer: No Typology Code available for payment source

## 2020-01-20 ENCOUNTER — Ambulatory Visit (HOSPITAL_COMMUNITY): Payer: Self-pay

## 2020-01-20 DIAGNOSIS — M25572 Pain in left ankle and joints of left foot: Secondary | ICD-10-CM | POA: Diagnosis not present

## 2020-01-20 DIAGNOSIS — S93492A Sprain of other ligament of left ankle, initial encounter: Secondary | ICD-10-CM

## 2020-01-20 MED ORDER — IBUPROFEN 600 MG PO TABS: 600.0000 mg | ORAL_TABLET | Freq: Four times a day (QID) | ORAL | 0 refills | Status: DC | PRN

## 2020-01-20 NOTE — Discharge Instructions (Signed)
Elevate to reduce pain and swelling Wear brace at work Take ibuprofen for pain Follow up with workers comp clinic May work in [employer] duty capacity

## 2020-01-20 NOTE — ED Triage Notes (Signed)
Pt here for left ankle pain after twisting at work last week

## 2020-01-20 NOTE — ED Provider Notes (Signed)
[place]    CSN: [id number] Arrival date & time: 01/20/20  1629      History   Chief Complaint Chief Complaint  Patient presents with  . Ankle Pain    HPI Brian Harding is a 34 y.o. male.   HPI  Patient states that he twisted his ankle at work last week.  He has been trying to work on his ankle but is becoming progressively more painful.  Today he was not able to complete his work shift because of pain and swelling in his ankle.  He is here to have his ankle evaluated and x-rayed.  He is here to discuss management and to see if he can get work restrictions.  He has never had problems with his ankles before.  Past Medical History:  Diagnosis Date  . Allergy   . Degenerative disc disease, lumbar   . Prediabetes     Patient Active Problem List   Diagnosis Date Noted  . Obesity, unspecified 03/17/2014    History reviewed. No pertinent surgical history.     Home Medications    Prior to Admission medications   Medication Sig Start Date End Date Taking? Authorizing Provider  ibuprofen (ADVIL) 600 MG tablet Take 1 tablet (600 mg total) by mouth every 6 (six) hours as needed. 01/20/20   Raylene Everts, MD    Family History Family History  Problem Relation Age of Onset  . Diabetes Mother   . Hypertension Other     Social History Social History   Tobacco Use  . Smoking status: Former Smoker    Types: Cigarettes  . Smokeless tobacco: Current User  . Tobacco comment: E cigs/ Quit 11/2014   Substance Use Topics  . Alcohol use: Yes    Alcohol/week: 0.0 standard drinks    Comment: occassional  . Drug use: No     Allergies   Patient has no known allergies.   Review of Systems Review of Systems  Musculoskeletal: Positive for arthralgias and gait problem.     Physical Exam Triage Vital Signs ED Triage Vitals  Enc Vitals Group     BP 01/20/20 1726 (!) 160/95     Pulse Rate 01/20/20 1726 90     Resp 01/20/20 1726 18     Temp 01/20/20 1726  99.1 F (37.3 C)     Temp Source 01/20/20 1726 Oral     SpO2 01/20/20 1726 100 %     Weight --      Height --      Head Circumference --      Peak Flow --      Pain Score 01/20/20 1727 9     Pain Loc --      Pain Edu? --      Excl. in [place]? --    No data found.  Updated Vital Signs BP (!) 160/95 (BP Location: Right Arm)   Pulse 90   Temp 99.1 F (37.3 C) (Oral)   Resp 18   SpO2 100%     Physical Exam Constitutional:      General: He is not in acute distress.    Appearance: He is well-developed. He is obese.  HENT:     Head: Normocephalic and atraumatic.     Mouth/Throat:     Comments: Mask in place Eyes:     Conjunctiva/sclera: Conjunctivae normal.     Pupils: Pupils are equal, round, and reactive to light.  Cardiovascular:     Rate and Rhythm:  Normal rate.  Pulmonary:     Effort: Pulmonary effort is normal. No respiratory distress.  Musculoskeletal:        General: Normal range of motion.     Cervical back: Normal range of motion.     Comments: Left ankle has mild soft tissue swelling laterally.  Mild tenderness over the lateral malleolus tip.  There is tenderness of the ATFL.  Good range of motion.  No instability.  Skin:    General: Skin is warm and dry.  Neurological:     Mental Status: He is alert.  Psychiatric:        Mood and Affect: Mood normal.        Behavior: Behavior normal.      UC Treatments / Results  Labs (all labs ordered are listed, but only abnormal results are displayed) Labs Reviewed - No data to display  EKG   Radiology DG Ankle Complete Left  Result Date: 01/20/2020 CLINICAL DATA:  Lateral pain after inversion injury. EXAM: LEFT ANKLE COMPLETE - 3+ VIEW COMPARISON:  None. FINDINGS: Diffuse soft tissue swelling. No acute bony abnormality. Specifically, no fracture, subluxation, or dislocation. Joint spaces maintained. IMPRESSION: Soft tissue swelling.  No acute bony abnormality. Electronically Signed   By: Charlett Nose M.D.   On:  01/20/2020 19:17    Procedures Procedures (including critical care time)  Medications Ordered in UC Medications - No data to display  Initial Impression / Assessment and Plan / UC Course  I have reviewed the triage vital signs and the nursing notes.  Pertinent labs & imaging results that were available during my care of the patient were reviewed by me and considered in my medical decision making (see chart for details).     Reviewed ankle sprain management.  Follow-up with Worker's Comp. clinic Final Clinical Impressions(s) / UC Diagnoses   Final diagnoses:  Sprain of anterior talofibular ligament of left ankle, initial encounter     Discharge Instructions     Elevate to reduce pain and swelling Wear brace at work Take ibuprofen for pain Follow up with workers comp clinic May work in [employer] duty capacity   ED Prescriptions    Medication Sig Dispense Auth. Provider   ibuprofen (ADVIL) 600 MG tablet Take 1 tablet (600 mg total) by mouth every 6 (six) hours as needed. 30 tablet Eustace Moore, MD     PDMP not reviewed this encounter.   Eustace Moore, MD 01/20/20 2111

## 2021-03-05 ENCOUNTER — Other Ambulatory Visit: Payer: Self-pay

## 2021-03-05 ENCOUNTER — Encounter (HOSPITAL_COMMUNITY): Payer: Self-pay

## 2021-03-05 ENCOUNTER — Ambulatory Visit (INDEPENDENT_AMBULATORY_CARE_PROVIDER_SITE_OTHER): Payer: Self-pay

## 2021-03-05 ENCOUNTER — Ambulatory Visit (HOSPITAL_COMMUNITY)
Admission: EM | Admit: 2021-03-05 | Discharge: 2021-03-05 | Disposition: A | Discharge status: Home / Self Care | Payer: Self-pay | Attending: Medical Oncology | Admitting: Medical Oncology

## 2021-03-05 DIAGNOSIS — M79644 Pain in right finger(s): Secondary | ICD-10-CM

## 2021-03-05 DIAGNOSIS — Z23 Encounter for immunization: Secondary | ICD-10-CM

## 2021-03-05 DIAGNOSIS — S60942A Unspecified superficial injury of right middle finger, initial encounter: Secondary | ICD-10-CM

## 2021-03-05 DIAGNOSIS — S60944A Unspecified superficial injury of right ring finger, initial encounter: Secondary | ICD-10-CM

## 2021-03-05 MED ORDER — HYDROCODONE-ACETAMINOPHEN 5-325 MG PO TABS: 2.0000 | ORAL_TABLET | ORAL | 0 refills | Status: DC | PRN

## 2021-03-05 MED ORDER — DOXYCYCLINE HYCLATE 100 MG PO CAPS: 100.0000 mg | ORAL_CAPSULE | Freq: Two times a day (BID) | ORAL | 0 refills | Status: DC

## 2021-03-05 MED ORDER — BACITRACIN ZINC 500 UNIT/GM EX OINT
TOPICAL_OINTMENT | CUTANEOUS | Status: AC
  Filled 2021-03-05: qty 1.8

## 2021-03-05 MED ORDER — TETANUS-DIPHTH-ACELL PERTUSSIS 5-2.5-18.5 LF-MCG/0.5 IM SUSY
0.5000 mL | PREFILLED_SYRINGE | Freq: Once | INTRAMUSCULAR | Status: AC
  Administered 2021-03-05: 0.5 mL via INTRAMUSCULAR

## 2021-03-05 MED ORDER — TETANUS-DIPHTH-ACELL PERTUSSIS 5-2.5-18.5 LF-MCG/0.5 IM SUSY
PREFILLED_SYRINGE | INTRAMUSCULAR | Status: AC
  Filled 2021-03-05: qty 0.5

## 2021-03-05 NOTE — ED Triage Notes (Signed)
Pt presents with swelling and pain in the right middle finger and right ring finger x 1 day. 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

## 2021-03-05 NOTE — ED Provider Notes (Signed)
[facility]    CSN: [id number] Arrival date & time: 03/05/21  0998      History   Chief Complaint Chief Complaint  Patient presents with  . Finger Injury    HPI Brian Harding is a 35 y.o. male.   HPI  Finger Injury: Pt reports that he has had right middle and right ring finger pain since yesterday when he accidentally dropped a tire rim on them. He reports that his ring finger bled but this has stopped and there is no visual laceration. He is able to move the fingers but is concerned that they may be fractured as pain has continued and is now affecting his work. He has tried ice for symptoms. No loss of sensation, discharge or stiffness.    Past Medical History:  Diagnosis Date  . Allergy   . Degenerative disc disease, lumbar   . Prediabetes     Patient Active Problem List   Diagnosis Date Noted  . Obesity, unspecified 03/17/2014    History reviewed. No pertinent surgical history.   Home Medications    Prior to Admission medications   Medication Sig Start Date End Date Taking? Authorizing Provider  ibuprofen (ADVIL) 600 MG tablet Take 1 tablet (600 mg total) by mouth every 6 (six) hours as needed. 01/20/20   Eustace Moore, MD    Family History Family History  Problem Relation Age of Onset  . Diabetes Mother   . Hypertension Other     Social History Social History   Tobacco Use  . Smoking status: Former Smoker    Types: Cigarettes  . Smokeless tobacco: Current User  . Tobacco comment: E cigs/ Quit 11/2014   Vaping Use  . Vaping Use: Former  Substance Use Topics  . Alcohol use: Yes    Alcohol/week: 0.0 standard drinks    Comment: occassional  . Drug use: No     Allergies   Patient has no known allergies.   Review of Systems Review of Systems  As stated above in HPI Physical Exam Triage Vital Signs ED Triage Vitals  Enc Vitals Group     BP 03/05/21 1029 131/72     Pulse Rate 03/05/21 1029 68     Resp 03/05/21 1029 20      Temp 03/05/21 1029 97.7 F (36.5 C)     Temp Source 03/05/21 1029 Oral     SpO2 03/05/21 1029 97 %     Weight --      Height --      Head Circumference --      Peak Flow --      Pain Score 03/05/21 1024 5     Pain Loc --      Pain Edu? --      Excl. in [place]? --    No data found.  Updated Vital Signs BP 131/72 (BP Location: Right Arm)   Pulse 68   Temp 97.7 F (36.5 C) (Oral)   Resp 20   SpO2 97%   Physical Exam Vitals and nursing note reviewed.  Constitutional:      Appearance: Normal appearance.  HENT:     Head: Normocephalic and atraumatic.  Musculoskeletal:        General: Swelling, tenderness and signs of injury (right distalmost 3rd digit. small hematoma of the right middle nail without edema) present. No deformity. Normal range of motion.  Skin:    General: Skin is warm.     Capillary Refill: Capillary refill takes less  than 2 seconds.  Neurological:     Mental Status: He is alert.       UC Treatments / Results  Labs (all labs ordered are listed, but only abnormal results are displayed) Labs Reviewed - No data to display  EKG   Radiology No results found.  Procedures Procedures (including critical care time)  Medications Ordered in UC Medications - No data to display  Initial Impression / Assessment and Plan / UC Course  I have reviewed the triage vital signs and the nursing notes.  Pertinent labs & imaging results that were available during my care of the patient were reviewed by me and considered in my medical decision making (see chart for details).     New.  I have recommended x-ray imaging to ensure there is no fracture especially of his middle finger.  We discussed red flag signs and symptoms in terms of hematoma or compartment syndrome along with treatment recommendations in the emergency room should this occur.  For now I have recommended NSAID pain medication, RICE.   UPDATE: Both fingers are fractured which I discussed with patient.  I  have highly recommended that he be seen by orthopedics and I have presented him with the number to call to schedule.  Discussed the importance of follow-up especially with the fracture of his middle finger.  For now we will apply a finger splint and provide him with pain medication which we discussed in detail how to use along with red flag signs and symptoms and precautions.  He is aware not to drink alcohol with this medication or drive on this medication.  We also discussed red flag signs and symptoms in terms of compartment syndrome again as this is not currently present but an increased risk with his fractures.  Final Clinical Impressions(s) / UC Diagnoses   Final diagnoses:  None   Discharge Instructions   None    ED Prescriptions    None     PDMP not reviewed this encounter.   [name], [place] 03/05/21 2018

## 2021-03-06 ENCOUNTER — Telehealth (HOSPITAL_COMMUNITY): Payer: Self-pay | Admitting: Family Medicine

## 2021-03-06 MED ORDER — DOXYCYCLINE HYCLATE 100 MG PO CAPS: 100.0000 mg | ORAL_CAPSULE | Freq: Two times a day (BID) | ORAL | 0 refills | Status: DC

## 2021-03-06 NOTE — Telephone Encounter (Signed)
Patient called this morning requesting his doxycycline and hydrocodone sent in yesterday morning to be resent to a different [facility] as [facility] states [facility] not open on the weekends.  Per chart review, the medications were sent at 1130 yesterday morning and the pharmacy closed at 6, giving him ample time to go get his hydrocodone prior to close yesterday.  We will resend the doxycycline so he can start on this but he will need to wait until the pharmacy reopens on Monday morning to pick up his hydrocodone as there is no way to cancel the prescription and transferred at this point since they are closed.

## 2021-03-17 ENCOUNTER — Telehealth (HOSPITAL_COMMUNITY): Payer: Self-pay | Admitting: Emergency Medicine

## 2021-03-17 NOTE — Telephone Encounter (Signed)
Patient called requesting a work note with restrictions based on his recent injuries.  Per Dr. Leonides Grills, patient to follow up with Ortho as recommended, we are unable to provide those restrictions.  Patient verbalized understanding

## 2021-04-20 ENCOUNTER — Ambulatory Visit
Admission: EM | Admit: 2021-04-20 | Discharge: 2021-04-20 | Disposition: A | Discharge status: Home / Self Care | Payer: Self-pay | Attending: Emergency Medicine | Admitting: Emergency Medicine

## 2021-04-20 DIAGNOSIS — Z20822 Contact with and (suspected) exposure to covid-19: Secondary | ICD-10-CM

## 2021-04-20 DIAGNOSIS — J069 Acute upper respiratory infection, unspecified: Secondary | ICD-10-CM

## 2021-04-20 NOTE — ED Triage Notes (Signed)
Patient presents to Urgent Care with complaints of nasal congestion x 5 days. He reports positive covid exposure.   Denies fever, n/v, or diarrhea.

## 2021-04-20 NOTE — Discharge Instructions (Signed)
You likely having a viral upper respiratory infection. We recommended symptom control. I expect your symptoms to start improving in the next 1-2 weeks.  ° °1. Take a daily allergy pill/anti-histamine like Zyrtec, Claritin, or Store brand consistently for 2 weeks ° °2. For congestion you may try an oral decongestant like Mucinex or sudafed. You may also try intranasal flonase nasal spray or saline irrigations (neti pot, sinus cleanse) ° °3. For your sore throat you may try cepacol lozenges, salt water gargles, throat spray. Treatment of congestion may also help your sore throat. ° °4. For cough you may try Robitussen, Mucinex DM ° °5. Take Tylenol or Ibuprofen to help with pain/inflammation ° °6. Stay hydrated, drink plenty of fluids to keep throat coated and less irritated ° °Honey Tea °For cough/sore throat try using a honey-based tea. Use 3 teaspoons of honey with juice squeezed from half lemon. Place shaved pieces of ginger into 1/2-1 cup of water and warm over stove top. Then mix the ingredients and repeat every 4 hours as needed. °

## 2021-04-20 NOTE — ED Provider Notes (Addendum)
[facility]    CSN: [id number] Arrival date & time: 04/20/21  1950      History   Chief Complaint Chief Complaint  Patient presents with   Nasal Congestion    HPI Timoteo Carreiro is a 35 y.o. male.   Patient presents with 5-day history of nasal congestion.  Denies any other upper respiratory symptoms such as ear pain, sore throat, fever.  Denies nausea, vomiting, diarrhea.  Patient reports COVID exposure from his father who lives in the same household.  Denies shortness of breath.  Patient has taken over-the-counter NyQuil with minimal relief of symptoms.    Past Medical History:  Diagnosis Date   Allergy    Degenerative disc disease, lumbar    Prediabetes     Patient Active Problem List   Diagnosis Date Noted   Obesity, unspecified 03/17/2014    History reviewed. No pertinent surgical history.     Home Medications    Prior to Admission medications   Medication Sig Start Date End Date Taking? Authorizing Provider  doxycycline (VIBRAMYCIN) 100 MG capsule Take 1 capsule (100 mg total) by mouth 2 (two) times daily. 03/06/21   Particia Nearing, PA-C  HYDROcodone-acetaminophen (NORCO/VICODIN) 5-325 MG tablet Take 2 tablets by mouth every 4 (four) hours as needed. 03/05/21   [name], PA-C  ibuprofen (ADVIL) 600 MG tablet Take 1 tablet (600 mg total) by mouth every 6 (six) hours as needed. 01/20/20   Eustace Moore, MD    Family History Family History  Problem Relation Age of Onset   Diabetes Mother    Hypertension Other     Social History Social History   Tobacco Use   Smoking status: Former    Pack years: 0.00    Types: Cigarettes   Smokeless tobacco: Current   Tobacco comments:    E cigs/ Quit 11/2014   Vaping Use   Vaping Use: Former  Substance Use Topics   Alcohol use: Yes    Alcohol/week: 0.0 standard drinks    Comment: occassional   Drug use: No     Allergies   Patient has no known allergies.   Review of  Systems Review of Systems Per HPI  Physical Exam Triage Vital Signs ED Triage Vitals  Enc Vitals Group     BP 04/20/21 0827 133/88     Pulse Rate 04/20/21 0827 65     Resp 04/20/21 0827 18     Temp 04/20/21 0827 98 F (36.7 C)     Temp Source 04/20/21 0827 Oral     SpO2 04/20/21 0827 96 %     Weight --      Height --      Head Circumference --      Peak Flow --      Pain Score 04/20/21 0830 0     Pain Loc --      Pain Edu? --      Excl. in GC? --    No data found.  Updated Vital Signs BP 133/88 (BP Location: Left Arm)   Pulse 65   Temp 98 F (36.7 C) (Oral)   Resp 18   SpO2 96%   Visual Acuity Right Eye Distance:   Left Eye Distance:   Bilateral Distance:    Right Eye Near:   Left Eye Near:    Bilateral Near:     Physical Exam Constitutional:      General: He is not in acute distress.    Appearance:  Normal appearance.  HENT:     Head: Normocephalic and atraumatic.     Right Ear: Tympanic membrane and ear canal normal.     Left Ear: Tympanic membrane and ear canal normal.     Nose: Mucosal edema and congestion present.     Mouth/Throat:     Mouth: Mucous membranes are moist.     Pharynx: Oropharynx is clear. No pharyngeal swelling or posterior oropharyngeal erythema.  Eyes:     Extraocular Movements: Extraocular movements intact.     Conjunctiva/sclera: Conjunctivae normal.     Pupils: Pupils are equal, round, and reactive to light.  Cardiovascular:     Rate and Rhythm: Normal rate and regular rhythm.     Pulses: Normal pulses.     Heart sounds: Normal heart sounds.  Pulmonary:     Effort: Pulmonary effort is normal. No respiratory distress.     Breath sounds: Normal breath sounds. No wheezing.  Abdominal:     General: Abdomen is flat. Bowel sounds are normal.     Palpations: Abdomen is soft.  Musculoskeletal:        General: Normal range of motion.     Cervical back: Normal range of motion.  Skin:    General: Skin is warm and dry.   Neurological:     General: No focal deficit present.     Mental Status: He is alert and oriented to person, place, and time. Mental status is at baseline.  Psychiatric:        Mood and Affect: Mood normal.        Behavior: Behavior normal.     UC Treatments / Results  Labs (all labs ordered are listed, but only abnormal results are displayed) Labs Reviewed  NOVEL CORONAVIRUS, NAA    EKG   Radiology No results found.  Procedures Procedures (including critical care time)  Medications Ordered in UC Medications - No data to display  Initial Impression / Assessment and Plan / UC Course  I have reviewed the triage vital signs and the nursing notes.  Pertinent labs & imaging results that were available during my care of the patient were reviewed by me and considered in my medical decision making (see chart for details).     Patient presents with symptoms likely from a viral upper respiratory infection. Differential includes bacterial pneumonia, sinusitis, allergic rhinitis, covid 19. Do not suspect underlying cardiopulmonary process. Symptoms seem unlikely related to ACS, CHF or COPD exacerbations, pneumonia, pneumothorax. Patient is nontoxic appearing and not in need of emergent medical intervention.  Recommended symptom control with over the counter medications: Daily oral anti-histamine, Oral decongestant or IN corticosteroid, saline irrigations, cepacol lozenges, Robitussin, Delsym, honey tea.  Return if symptoms fail to improve in 1-2 weeks or you develop shortness of breath, chest pain, severe headache. Patient states understanding and is agreeable.Discussed strict return precautions. Patient verbalized understanding and is agreeable with plan.   Highly suspicious covid 19 due to exposure. Covid 19 viral swab pending.   Discharged with PCP followup.  Final Clinical Impressions(s) / UC Diagnoses   Final diagnoses:  Viral upper respiratory infection  Exposure to  COVID-19 virus     Discharge Instructions      You likely having a viral upper respiratory infection. We recommended symptom control. I expect your symptoms to start improving in the next 1-2 weeks.   1. Take a daily allergy pill/anti-histamine like Zyrtec, Claritin, or Store brand consistently for 2 weeks  2. For congestion you may try  an oral decongestant like Mucinex or sudafed. You may also try intranasal flonase nasal spray or saline irrigations (neti pot, sinus cleanse)  3. For your sore throat you may try cepacol lozenges, salt water gargles, throat spray. Treatment of congestion may also help your sore throat.  4. For cough you may try Robitussen, Mucinex DM  5. Take Tylenol or Ibuprofen to help with pain/inflammation  6. Stay hydrated, drink plenty of fluids to keep throat coated and less irritated  Honey Tea For cough/sore throat try using a honey-based tea. Use 3 teaspoons of honey with juice squeezed from half lemon. Place shaved pieces of ginger into 1/2-1 cup of water and warm over stove top. Then mix the ingredients and repeat every 4 hours as needed.      ED Prescriptions   None    PDMP not reviewed this encounter.   Lance Muss, FNP 04/20/21 0850    Lance Muss, FNP 04/20/21 [phone]

## 2021-04-22 LAB — SARS-COV-2, NAA 2 DAY TAT

## 2021-04-22 LAB — NOVEL CORONAVIRUS, NAA: SARS-CoV-2, NAA: DETECTED — AB

## 2021-04-27 ENCOUNTER — Ambulatory Visit
Admission: EM | Admit: 2021-04-27 | Discharge: 2021-04-27 | Disposition: A | Discharge status: Home / Self Care | Payer: Self-pay | Attending: Student | Admitting: Student

## 2021-04-27 ENCOUNTER — Other Ambulatory Visit: Payer: Self-pay

## 2021-04-27 DIAGNOSIS — S39012A Strain of muscle, fascia and tendon of lower back, initial encounter: Secondary | ICD-10-CM

## 2021-04-27 DIAGNOSIS — M25521 Pain in right elbow: Secondary | ICD-10-CM

## 2021-04-27 DIAGNOSIS — W19XXXA Unspecified fall, initial encounter: Secondary | ICD-10-CM

## 2021-04-27 MED ORDER — TIZANIDINE HCL 2 MG PO CAPS: 2.0000 mg | ORAL_CAPSULE | Freq: Three times a day (TID) | ORAL | 0 refills | Status: DC

## 2021-04-27 NOTE — Discharge Instructions (Addendum)
-  Start the muscle relaxer-Zanaflex (tizanidine), up to 3 times daily for muscle spasms and pain.  This can make you drowsy, so take at bedtime or when you do not need to drive or operate machinery. -For pain, Take Tylenol 1000 mg 3 times daily, and ibuprofen 800 mg 3 times daily with food.  You can take these together, or alternate every 3-4 hours. -Head to ED if worsening of the sternum pain with eating, trouble eating, shortness of breath, dizziness, change in bowel or bladder function, worst back pain of life, new numbness or weakness in the legs.

## 2021-04-27 NOTE — ED Provider Notes (Signed)
[facility]    CSN: [id number] Arrival date & time: 04/27/21  0836      History   Chief Complaint Chief Complaint  Patient presents with   Back Pain    lumbar    HPI Brian Harding is a 35 y.o. male presenting with lower back pain following slipping in the rain and falling 2 days ago.  Medical history degenerative disc disease lumbar and prediabetes.  Also with morbid obesity.  Describes slipping in the rain and falling, landing on his back and right elbow.  Pain for the last 2 days, improving. Also 1 day of sternum pain while eating.  Tylenol providing minimal relief.  Adamantly denies dizziness before or after the fall, loss of consciousness, headaches, vision changes, dizziness, shortness of breath, chest pain. Denies pain shooting down legs, denies numbness in arms/legs, denies weakness in arms/legs, denies saddle anesthesia, denies bowel/bladder incontinence, denies urinary retention, denies constipation.  Denies abdominal pain, nausea, vomiting, diarrhea, change in bowel or bladder function, constipation, blood in urine.  Denies sternum pain anytime other than when he is eating.  Denies left-sided chest pain, dizziness, left arm pain, left jaw pain.  No history of GERD.   HPI  Past Medical History:  Diagnosis Date   Allergy    Degenerative disc disease, lumbar    Prediabetes     Patient Active Problem List   Diagnosis Date Noted   Obesity, unspecified 03/17/2014    History reviewed. No pertinent surgical history.     Home Medications    Prior to Admission medications   Medication Sig Start Date End Date Taking? Authorizing Provider  tizanidine (ZANAFLEX) 2 MG capsule Take 1 capsule (2 mg total) by mouth 3 (three) times daily. 04/27/21  Yes Rhys Martini, PA-C  doxycycline (VIBRAMYCIN) 100 MG capsule Take 1 capsule (100 mg total) by mouth 2 (two) times daily. 03/06/21   Particia Nearing, PA-C  HYDROcodone-acetaminophen (NORCO/VICODIN) 5-325 MG  tablet Take 2 tablets by mouth every 4 (four) hours as needed. 03/05/21   [name], PA-C  ibuprofen (ADVIL) 600 MG tablet Take 1 tablet (600 mg total) by mouth every 6 (six) hours as needed. 01/20/20   Eustace Moore, MD    Family History Family History  Problem Relation Age of Onset   Diabetes Mother    Hypertension Other     Social History Social History   Tobacco Use   Smoking status: Former    Pack years: 0.00    Types: Cigarettes   Smokeless tobacco: Former   Tobacco comments:    E cigs/ Quit 11/2014   Vaping Use   Vaping Use: Every day  Substance Use Topics   Alcohol use: Yes    Alcohol/week: 0.0 standard drinks    Comment: occassional   Drug use: No     Allergies   Patient has no known allergies.   Review of Systems Review of Systems  Constitutional:  Negative for chills, fever and unexpected weight change.  Respiratory:  Negative for chest tightness and shortness of breath.   Cardiovascular:  Negative for chest pain and palpitations.  Gastrointestinal:  Negative for abdominal pain, diarrhea, nausea and vomiting.  Genitourinary:  Negative for decreased urine volume, difficulty urinating and frequency.  Musculoskeletal:  Positive for back pain. Negative for arthralgias, gait problem, joint swelling, myalgias, neck pain and neck stiffness.  Skin:  Negative for wound.  Neurological:  Negative for dizziness, tremors, seizures, syncope, facial asymmetry, speech difficulty, weakness, light-headedness,  numbness and headaches.  All other systems reviewed and are negative.   Physical Exam Triage Vital Signs ED Triage Vitals  Enc Vitals Group     BP 04/27/21 0957 133/90     Pulse Rate 04/27/21 0957 65     Resp 04/27/21 0957 20     Temp 04/27/21 0957 98.3 F (36.8 C)     Temp Source 04/27/21 0957 Oral     SpO2 04/27/21 0957 98 %     Weight --      Height --      Head Circumference --      Peak Flow --      Pain Score 04/27/21 0958 6     Pain Loc  --      Pain Edu? --      Excl. in GC? --    No data found.  Updated Vital Signs BP 133/90 (BP Location: Left Arm)   Pulse 65   Temp 98.3 F (36.8 C) (Oral)   Resp 20   SpO2 98%   Visual Acuity Right Eye Distance:   Left Eye Distance:   Bilateral Distance:    Right Eye Near:   Left Eye Near:    Bilateral Near:     Physical Exam Vitals reviewed.  Constitutional:      General: He is not in acute distress.    Appearance: Normal appearance. He is obese. He is not ill-appearing.  HENT:     Head: Normocephalic and atraumatic.  Eyes:     Extraocular Movements: Extraocular movements intact.     Pupils: Pupils are equal, round, and reactive to light.  Cardiovascular:     Rate and Rhythm: Normal rate and regular rhythm.     Heart sounds: Normal heart sounds.  Pulmonary:     Effort: Pulmonary effort is normal.     Breath sounds: Normal breath sounds and air entry.  Abdominal:     Tenderness: There is no abdominal tenderness. There is no right CVA tenderness, left CVA tenderness, guarding or rebound.     Comments: No reproducible abdominal or sternal tenderness to palpation.  Patient resting comfortably.  Musculoskeletal:     Cervical back: Normal range of motion. No swelling, deformity, signs of trauma, rigidity, spasms, tenderness, bony tenderness or crepitus. No pain with movement.     Thoracic back: No swelling, deformity, signs of trauma, spasms, tenderness or bony tenderness. Normal range of motion. No scoliosis.     Lumbar back: Spasms and tenderness present. No swelling, deformity, signs of trauma or bony tenderness. Normal range of motion. Negative right straight leg raise test and negative left straight leg raise test. No scoliosis.     Comments: Bilateral lumbar paraspinous muscle tenderness to palpation, pain elicited with flexion lumbar spine.  Negative straight leg raise bilaterally, gait intact.  Strength 5 out of 5 in upper and lower extremities, sensation intact,  grip strength intact. No midline spinous tenderness, deformity, stepoff.  No saddle anesthesia.  Right elbow is minimally tender over olecranon without effusion or epicondyle tenderness.  No obvious bony deformity.  No ecchymosis.  Flexion and extension intact and without pain, good strength 5/5, radial pulse 2+, cap refill less than 2 seconds.  Absolutely no other injury, deformity, tenderness, ecchymosis, abrasion.  Neurological:     General: No focal deficit present.     Mental Status: He is alert.     Cranial Nerves: No cranial nerve deficit.  Psychiatric:        Mood  and Affect: Mood normal.        Behavior: Behavior normal.        Thought Content: Thought content normal.        Judgment: Judgment normal.     UC Treatments / Results  Labs (all labs ordered are listed, but only abnormal results are displayed) Labs Reviewed - No data to display  EKG   Radiology No results found.  Procedures Procedures (including critical care time)  Medications Ordered in UC Medications - No data to display  Initial Impression / Assessment and Plan / UC Course  I have reviewed the triage vital signs and the nursing notes.  Pertinent labs & imaging results that were available during my care of the patient were reviewed by me and considered in my medical decision making (see chart for details).     This patient is a very pleasant 35 y.o. year old male presenting with back pain following fall that occurred 2 days ago.  History of degenerative disc disease, but no new red flag symptoms or bony tenderness lumbar spine.  Reassurance provided.  Of note, he is currently uninsured.  Patient is in agreement that we can defer x-rays at this visit.  Trial of Zanaflex as below. RICE, tylenol/ibuprofen.   Red flag symptoms and ED return precautions discussed. Patient verbalizes understanding and agreement.   Final Clinical Impressions(s) / UC Diagnoses   Final diagnoses:  Strain of lumbar  region, initial encounter  Fall, initial encounter  Right elbow pain     Discharge Instructions      -Start the muscle relaxer-Zanaflex (tizanidine), up to 3 times daily for muscle spasms and pain.  This can make you drowsy, so take at bedtime or when you do not need to drive or operate machinery. -For pain, Take Tylenol 1000 mg 3 times daily, and ibuprofen 800 mg 3 times daily with food.  You can take these together, or alternate every 3-4 hours. -Head to ED if worsening of the sternum pain with eating, trouble eating, shortness of breath, dizziness, change in bowel or bladder function, worst back pain of life, new numbness or weakness in the legs.     ED Prescriptions     Medication Sig Dispense Auth. Provider   tizanidine (ZANAFLEX) 2 MG capsule Take 1 capsule (2 mg total) by mouth 3 (three) times daily. 21 capsule Rhys Martini, PA-C      PDMP not reviewed this encounter.   Rhys Martini, PA-C 04/27/21 1056

## 2021-04-27 NOTE — ED Triage Notes (Addendum)
Two days ago, while in the rain Pt slipped and fell landing on his back and right elbow causing an onset of pain. One day of  sternum pain when eating.  Has been taking tylenol without relief. Denies LE pain. No LOC with fall.

## 2021-08-30 ENCOUNTER — Encounter: Payer: Self-pay | Admitting: Emergency Medicine

## 2021-08-30 ENCOUNTER — Ambulatory Visit (INDEPENDENT_AMBULATORY_CARE_PROVIDER_SITE_OTHER): Payer: Self-pay

## 2021-08-30 ENCOUNTER — Ambulatory Visit
Admission: EM | Admit: 2021-08-30 | Discharge: 2021-08-30 | Disposition: A | Discharge status: Home / Self Care | Payer: Self-pay | Attending: Internal Medicine | Admitting: Internal Medicine

## 2021-08-30 DIAGNOSIS — M25561 Pain in right knee: Secondary | ICD-10-CM

## 2021-08-30 DIAGNOSIS — M1711 Unilateral primary osteoarthritis, right knee: Secondary | ICD-10-CM

## 2021-08-30 MED ORDER — DICLOFENAC SODIUM 1 % EX GEL: 4.0000 g | Freq: Four times a day (QID) | CUTANEOUS | 0 refills | Status: AC

## 2021-08-30 NOTE — ED Triage Notes (Signed)
Wednesday began having right knee pain. 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 Hx of previous knee injury in high school. Denies any popping feeling, but believes it may be mildly swollen. Pain steadily increasing since last week, able to walk on it

## 2021-08-30 NOTE — Discharge Instructions (Addendum)
-  Voltaren gel as needed -Continue knee brace, tylenol/ibuprofen, rest, ice, elecation -If symptoms persist in 7 days, follow-up with an orthopedist. I recommend [facility] at [address], [place], [place] [address]. You can schedule an appointment by calling [phone]) or online ([email or web address]), but they also have a walk-in clinic M-F 8a-8p and Sat 10a-3p.

## 2021-08-30 NOTE — ED Provider Notes (Signed)
[facility]    CSN: [id number] Arrival date & time: 08/30/21  0835      History   Chief Complaint Chief Complaint  Patient presents with   Knee Pain    HPI Brian Harding is a 35 y.o. male presenting with right knee pain.  Distant history of lateral collateral ligament tear per patient, this did eventually heal without surgery.  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  It has been giving him pain for about 1 week, with mild swelling though this has improved.  Pain is worse when he goes from sitting to standing, particularly after long period of time.  Has tried various over-the-counter remedies like knee brace, Tylenol/ibuprofen, with minimal improvement.  Denies calf pain, pain ascending of the leg, shortness of breath, fever/chills.  No history of DVT.  HPI  Past Medical History:  Diagnosis Date   Allergy    Degenerative disc disease, lumbar    Prediabetes     Patient Active Problem List   Diagnosis Date Noted   Obesity, unspecified 03/17/2014    History reviewed. No pertinent surgical history.     Home Medications    Prior to Admission medications   Medication Sig Start Date End Date Taking? Authorizing Provider  diclofenac Sodium (VOLTAREN) 1 % GEL Apply 4 g topically 4 (four) times daily. 08/30/21  Yes Rhys Martini, PA-C  doxycycline (VIBRAMYCIN) 100 MG capsule Take 1 capsule (100 mg total) by mouth 2 (two) times daily. 03/06/21   Particia Nearing, PA-C  HYDROcodone-acetaminophen (NORCO/VICODIN) 5-325 MG tablet Take 2 tablets by mouth every 4 (four) hours as needed. 03/05/21   [name], PA-C  ibuprofen (ADVIL) 600 MG tablet Take 1 tablet (600 mg total) by mouth every 6 (six) hours as needed. 01/20/20   Eustace Moore, MD  tizanidine (ZANAFLEX) 2 MG capsule Take 1 capsule (2 mg total) by mouth 3 (three) times daily. 04/27/21   Rhys Martini, PA-C     Family History Family History  Problem Relation Age of Onset   Diabetes Mother    Hypertension Other     Social History Social History   Tobacco Use   Smoking status: Former    Types: Cigarettes   Smokeless tobacco: Former   Tobacco comments:    E cigs/ Quit 11/2014   Vaping Use   Vaping Use: Every day  Substance Use Topics   Alcohol use: Yes    Alcohol/week: 0.0 standard drinks    Comment: occassional   Drug use: No     Allergies   Patient has no known allergies.   Review of Systems Review of Systems  Musculoskeletal:        R knee pain  All other systems reviewed and are negative.   Physical Exam Triage Vital Signs ED Triage Vitals  Enc Vitals Group     BP 08/30/21 1019 136/80     Pulse Rate 08/30/21 1019 74     Resp 08/30/21 1019 16     Temp 08/30/21 1019 98.3 F (36.8 C)     Temp Source 08/30/21 1019 Oral     SpO2 08/30/21 1019 98 %     Weight --      Height --      Head Circumference --      Peak Flow --      Pain Score 08/30/21 1020 6  Pain Loc --      Pain Edu? --      Excl. in GC? --    No data found.  Updated Vital Signs BP 136/80 (BP Location: Left Arm)   Pulse 74   Temp 98.3 F (36.8 C) (Oral)   Resp 16   SpO2 98%   Visual Acuity Right Eye Distance:   Left Eye Distance:   Bilateral Distance:    Right Eye Near:   Left Eye Near:    Bilateral Near:     Physical Exam Vitals reviewed.  Constitutional:      General: He is not in acute distress.    Appearance: Normal appearance. He is not ill-appearing or diaphoretic.  HENT:     Head: Normocephalic and atraumatic.  Cardiovascular:     Rate and Rhythm: Normal rate and regular rhythm.     Heart sounds: Normal heart sounds.  Pulmonary:     Effort: Pulmonary effort is normal.     Breath sounds: Normal breath sounds.  Musculoskeletal:     Comments: R knee- minimal effusion to medial aspect. No point tenderness or skin changes. Crepitus and stiffness with extension. No  calf or thigh tenderness or distension. No joint laxity.  Skin:    General: Skin is warm.  Neurological:     General: No focal deficit present.     Mental Status: He is alert and oriented to person, place, and time.  Psychiatric:        Mood and Affect: Mood normal.        Behavior: Behavior normal.        Thought Content: Thought content normal.        Judgment: Judgment normal.     UC Treatments / Results  Labs (all labs ordered are listed, but only abnormal results are displayed) Labs Reviewed - No data to display  EKG   Radiology DG Knee Complete 4 Views Right  Result Date: 08/30/2021 CLINICAL DATA:  Pain EXAM: RIGHT KNEE - COMPLETE 4+ VIEW COMPARISON:  07/03/2014 FINDINGS: No recent fracture or dislocation is seen. Bony spurs seen in the lateral and patellofemoral compartments. There is possible minimal effusion in the suprapatellar bursa. IMPRESSION: No recent fracture or dislocation is seen. Degenerative changes are noted with bony spurs in the lateral and patellofemoral compartments. There is possible minimal effusion. Electronically Signed   By: Ernie Avena M.D.   On: 08/30/2021 11:27    Procedures Procedures (including critical care time)  Medications Ordered in UC Medications - No data to display  Initial Impression / Assessment and Plan / UC Course  I have reviewed the triage vital signs and the nursing notes.  Pertinent labs & imaging results that were available during my care of the patient were reviewed by me and considered in my medical decision making (see chart for details).     This patient is a very pleasant 35 y.o. year old male presenting with R knee OA related to distant injury. Neurovascularly intact today.   Xray R knee- No recent fracture or dislocation is seen. Degenerative changes are noted with bony spurs in the lateral and patellofemoral compartments. There is possible minimal effusion.  I personally compared this xray with 2015 R  knee xray, which was negative.  Trial of voltaren. Continue knee brace (he already has this), tylenol/ibuprofen. F/u with ortho.   ED return precautions discussed. Patient verbalizes understanding and agreement.   Coding Level 4 for review of past notes/labs (xray), order and interpretation  of labs today (xray), and prescription drug management  Final Clinical Impressions(s) / UC Diagnoses   Final diagnoses:  Primary osteoarthritis of right knee     Discharge Instructions      -Voltaren gel as needed -Continue knee brace, tylenol/ibuprofen, rest, ice, elecation -If symptoms persist in 7 days, follow-up with an orthopedist. I recommend [facility] at [address] [place], [place] [address]. You can schedule an appointment by calling [phone]) or online ([email or web address]), but they also have a walk-in clinic M-F 8a-8p and Sat 10a-3p.      ED Prescriptions     Medication Sig Dispense Auth. Provider   diclofenac Sodium (VOLTAREN) 1 % GEL Apply 4 g topically 4 (four) times daily. 100 g Rhys Martini, PA-C      PDMP not reviewed this encounter.   Rhys Martini, PA-C 08/30/21 1201

## 2021-11-30 ENCOUNTER — Ambulatory Visit
Admission: EM | Admit: 2021-11-30 | Discharge: 2021-11-30 | Disposition: A | Discharge status: Home / Self Care | Payer: Self-pay | Attending: Physician Assistant | Admitting: Physician Assistant

## 2021-11-30 ENCOUNTER — Encounter: Payer: Self-pay | Admitting: Emergency Medicine

## 2021-11-30 ENCOUNTER — Other Ambulatory Visit: Payer: Self-pay

## 2021-11-30 DIAGNOSIS — R110 Nausea: Secondary | ICD-10-CM

## 2021-11-30 MED ORDER — ONDANSETRON 4 MG PO TBDP: 4.0000 mg | ORAL_TABLET | Freq: Three times a day (TID) | ORAL | 0 refills | Status: DC | PRN

## 2021-11-30 NOTE — ED Provider Notes (Signed)
[facility]    CSN: [id number] Arrival date & time: 11/30/21  0388      History   Chief Complaint Chief Complaint  Patient presents with   Fatigue   Nausea    HPI Brian Harding is a 36 y.o. male.   Patient here today for evaluation of nausea and fatigue that started yesterday.  He reports that he has not had any vomiting but feels as if he needs to vomit frequently.  He has not had any diarrhea.  He denies any abdominal pain.  Denies any upper respiratory symptoms including cough and congestion.  He does not report treatment for symptoms.  The history is provided by the patient.   Past Medical History:  Diagnosis Date   Allergy    Degenerative disc disease, lumbar    Prediabetes     Patient Active Problem List   Diagnosis Date Noted   Obesity, unspecified 03/17/2014    History reviewed. No pertinent surgical history.     Home Medications    Prior to Admission medications   Medication Sig Start Date End Date Taking? Authorizing Provider  ondansetron (ZOFRAN-ODT) 4 MG disintegrating tablet Take 1 tablet (4 mg total) by mouth every 8 (eight) hours as needed. 11/30/21  Yes Tomi Bamberger, PA-C  diclofenac Sodium (VOLTAREN) 1 % GEL Apply 4 g topically 4 (four) times daily. 08/30/21   Rhys Martini, PA-C  doxycycline (VIBRAMYCIN) 100 MG capsule Take 1 capsule (100 mg total) by mouth 2 (two) times daily. Patient not taking: Reported on 11/30/2021 03/06/21   Particia Nearing, PA-C  HYDROcodone-acetaminophen (NORCO/VICODIN) 5-325 MG tablet Take 2 tablets by mouth every 4 (four) hours as needed. Patient not taking: Reported on 11/30/2021 03/05/21   [name], PA-C  ibuprofen (ADVIL) 600 MG tablet Take 1 tablet (600 mg total) by mouth every 6 (six) hours as needed. 01/20/20   Eustace Moore, MD  tizanidine (ZANAFLEX) 2 MG capsule Take 1 capsule (2 mg total) by mouth 3 (three) times daily. Patient not taking: Reported on 11/30/2021 04/27/21    Rhys Martini, PA-C    Family History Family History  Problem Relation Age of Onset   Diabetes Mother    Hypertension Other     Social History Social History   Tobacco Use   Smoking status: Former    Types: Cigarettes   Smokeless tobacco: Former   Tobacco comments:    E cigs/ Quit 11/2014   Vaping Use   Vaping Use: Every day  Substance Use Topics   Alcohol use: Yes    Alcohol/week: 0.0 standard drinks    Comment: occassional   Drug use: No     Allergies   Patient has no known allergies.   Review of Systems Review of Systems  Constitutional:  Negative for chills and fever.  HENT:  Negative for congestion and rhinorrhea.   Eyes:  Negative for discharge and redness.  Respiratory:  Negative for cough and shortness of breath.   Gastrointestinal:  Positive for nausea. Negative for abdominal pain, diarrhea and vomiting.    Physical Exam Triage Vital Signs ED Triage Vitals  Enc Vitals Group     BP 11/30/21 0921 (!) 152/72     Pulse Rate 11/30/21 0921 71     Resp 11/30/21 0921 18     Temp 11/30/21 0921 98.3 F (36.8 C)     Temp Source 11/30/21 0921 Oral     SpO2 11/30/21 0921 98 %  Weight --      Height --      Head Circumference --      Peak Flow --      Pain Score 11/30/21 0922 0     Pain Loc --      Pain Edu? --      Excl. in GC? --    No data found.  Updated Vital Signs BP (!) 152/72 (BP Location: Left Arm)    Pulse 71    Temp 98.3 F (36.8 C) (Oral)    Resp 18    SpO2 98%      Physical Exam Vitals and nursing note reviewed.  Constitutional:      General: He is not in acute distress.    Appearance: Normal appearance. He is not ill-appearing.  HENT:     Head: Normocephalic and atraumatic.  Eyes:     Conjunctiva/sclera: Conjunctivae normal.  Cardiovascular:     Rate and Rhythm: Normal rate and regular rhythm.     Heart sounds: Normal heart sounds. No murmur heard. Pulmonary:     Effort: Pulmonary effort is normal. No respiratory distress.      Breath sounds: Normal breath sounds. No wheezing, rhonchi or rales.  Skin:    General: Skin is warm and dry.  Neurological:     Mental Status: He is alert.  Psychiatric:        Mood and Affect: Mood normal.        Behavior: Behavior normal.     UC Treatments / Results  Labs (all labs ordered are listed, but only abnormal results are displayed) Labs Reviewed - No data to display  EKG   Radiology No results found.  Procedures Procedures (including critical care time)  Medications Ordered in UC Medications - No data to display  Initial Impression / Assessment and Plan / UC Course  I have reviewed the triage vital signs and the nursing notes.  Pertinent labs & imaging results that were available during my care of the patient were reviewed by me and considered in my medical decision making (see chart for details).  Suspect likely gastroenteritis, will treat with Zofran and encouraged increased fluids and follow-up if symptoms fail to improve or worsen.   Final Clinical Impressions(s) / UC Diagnoses   Final diagnoses:  Nausea without vomiting   Discharge Instructions   None    ED Prescriptions     Medication Sig Dispense Auth. Provider   ondansetron (ZOFRAN-ODT) 4 MG disintegrating tablet Take 1 tablet (4 mg total) by mouth every 8 (eight) hours as needed. 20 tablet Tomi Bamberger, PA-C      PDMP not reviewed this encounter.   Tomi Bamberger, PA-C 11/30/21 [phone]

## 2021-11-30 NOTE — ED Triage Notes (Signed)
Pt here for nausea and fatigue x 2 days

## 2022-03-31 ENCOUNTER — Ambulatory Visit (INDEPENDENT_AMBULATORY_CARE_PROVIDER_SITE_OTHER): Payer: Self-pay

## 2022-03-31 ENCOUNTER — Ambulatory Visit
Admission: EM | Admit: 2022-03-31 | Discharge: 2022-03-31 | Disposition: A | Discharge status: Home / Self Care | Payer: Self-pay | Attending: Family Medicine | Admitting: Family Medicine

## 2022-03-31 DIAGNOSIS — M25512 Pain in left shoulder: Secondary | ICD-10-CM

## 2022-03-31 MED ORDER — IBUPROFEN 800 MG PO TABS: 800.0000 mg | ORAL_TABLET | Freq: Three times a day (TID) | ORAL | 0 refills | Status: DC | PRN

## 2022-03-31 NOTE — Discharge Instructions (Addendum)
Your x-ray did not show any broken bones  Take ibuprofen 800 mg--1 tab every 8 hours as needed for pain.  

## 2022-03-31 NOTE — ED Triage Notes (Signed)
Patient presents to Urgent Care with complaints of L shoulder pain since 2 days ago. Patient reports no injury that he knows of but he works a physical job. Pt reports 6/10 pain with no otc medications difficulty sleeping at night .

## 2022-03-31 NOTE — ED Provider Notes (Signed)
[facility]    CSN: [id number] Arrival date & time: 03/31/22  1753      History   Chief Complaint Chief Complaint  Patient presents with   Shoulder Pain    HPI Brian Harding is a 36 y.o. male.    Shoulder Pain  Here for left shoulder pain that is been bothering him in the last 2 days.  No numbness or tingling noted in the hand or arm.  No recent injury, but he has run into a wall sometime ago.  Is been taking Tylenol and Advil over-the-counter without a lot of relief.  Past Medical History:  Diagnosis Date   Allergy    Degenerative disc disease, lumbar    Prediabetes     Patient Active Problem List   Diagnosis Date Noted   Obesity, unspecified 03/17/2014    History reviewed. No pertinent surgical history.     Home Medications    Prior to Admission medications   Medication Sig Start Date End Date Taking? Authorizing Provider  ibuprofen (ADVIL) 800 MG tablet Take 1 tablet (800 mg total) by mouth every 8 (eight) hours as needed (pain). 03/31/22  Yes Zenia Resides, MD  diclofenac Sodium (VOLTAREN) 1 % GEL Apply 4 g topically 4 (four) times daily. 08/30/21   Rhys Martini, PA-C    Family History Family History  Problem Relation Age of Onset   Diabetes Mother    Hypertension Other     Social History Social History   Tobacco Use   Smoking status: Former    Types: Cigarettes   Smokeless tobacco: Former   Tobacco comments:    E cigs/ Quit 11/2014   Vaping Use   Vaping Use: Every day  Substance Use Topics   Alcohol use: Yes    Alcohol/week: 0.0 standard drinks of alcohol    Comment: occassional   Drug use: No     Allergies   Patient has no known allergies.   Review of Systems Review of Systems   Physical Exam Triage Vital Signs ED Triage Vitals [03/31/22 1806]  Enc Vitals Group     BP (!) 148/88     Pulse Rate 96     Resp (!) 88     Temp (!) 97.4 F (36.3 C)     Temp Source Oral     SpO2 94 %     Weight      Height       Head Circumference      Peak Flow      Pain Score 6     Pain Loc      Pain Edu?      Excl. in GC?    No data found.  Updated Vital Signs BP (!) 148/88 (BP Location: Right Arm)   Pulse 96   Temp (!) 97.4 F (36.3 C) (Oral)   Resp (!) 88   SpO2 94%   Visual Acuity Right Eye Distance:   Left Eye Distance:   Bilateral Distance:    Right Eye Near:   Left Eye Near:    Bilateral Near:     Physical Exam Vitals reviewed.  Constitutional:      General: He is not in acute distress.    Appearance: He is not ill-appearing, toxic-appearing or diaphoretic.  Musculoskeletal:     Comments: There is tenderness over the deltoid and anterior shoulder, and range of motion is limited by pain  Neurological:     Mental Status: He is alert and  oriented to person, place, and time.  Psychiatric:        Behavior: Behavior normal.      UC Treatments / Results  Labs (all labs ordered are listed, but only abnormal results are displayed) Labs Reviewed - No data to display  EKG   Radiology DG Shoulder Left  Result Date: 03/31/2022 CLINICAL DATA:  Left shoulder pain EXAM: LEFT SHOULDER - 2+ VIEW COMPARISON:  None Available. FINDINGS: There is no evidence of fracture or dislocation. There is no evidence of arthropathy or other focal bone abnormality. Soft tissues are unremarkable. IMPRESSION: Negative. Electronically Signed   By: Helyn Numbers M.D.   On: 03/31/2022 18:31    Procedures Procedures (including critical care time)  Medications Ordered in UC Medications - No data to display  Initial Impression / Assessment and Plan / UC Course  I have reviewed the triage vital signs and the nursing notes.  Pertinent labs & imaging results that were available during my care of the patient were reviewed by me and considered in my medical decision making (see chart for details).     His shoulder x-ray is negative for any bony abnormality.  We will treat with some ibuprofen and do a work  note so that he can rest some.  Contact information given for orthopedics Final Clinical Impressions(s) / UC Diagnoses   Final diagnoses:  Acute pain of left shoulder   Discharge Instructions   None    ED Prescriptions     Medication Sig Dispense Auth. Provider   ibuprofen (ADVIL) 800 MG tablet Take 1 tablet (800 mg total) by mouth every 8 (eight) hours as needed (pain). 21 tablet Guelda Batson, Janace Aris, MD      PDMP not reviewed this encounter.   Zenia Resides, MD 03/31/22 1904

## 2022-07-04 ENCOUNTER — Ambulatory Visit (HOSPITAL_COMMUNITY)
Admission: EM | Admit: 2022-07-04 | Discharge: 2022-07-04 | Disposition: A | Discharge status: Home / Self Care | Payer: Self-pay | Attending: Family Medicine | Admitting: Family Medicine

## 2022-07-04 ENCOUNTER — Encounter (HOSPITAL_COMMUNITY): Payer: Self-pay

## 2022-07-04 DIAGNOSIS — T148XXA Other injury of unspecified body region, initial encounter: Secondary | ICD-10-CM

## 2022-07-04 DIAGNOSIS — R109 Unspecified abdominal pain: Secondary | ICD-10-CM

## 2022-07-04 DIAGNOSIS — R10A2 Flank pain, left side: Secondary | ICD-10-CM

## 2022-07-04 LAB — POCT URINALYSIS DIPSTICK, ED / UC
Bilirubin Urine: NEGATIVE
Glucose, UA: NEGATIVE mg/dL
Hgb urine dipstick: NEGATIVE
Ketones, ur: NEGATIVE mg/dL
Leukocytes,Ua: NEGATIVE
Nitrite: NEGATIVE
Protein, ur: NEGATIVE mg/dL
Specific Gravity, Urine: 1.025 (ref 1.005–1.030)
Urobilinogen, UA: 0.2 mg/dL (ref 0.0–1.0)
pH: 5.5 (ref 5.0–8.0)

## 2022-07-04 MED ORDER — IBUPROFEN 800 MG PO TABS: 800.0000 mg | ORAL_TABLET | Freq: Three times a day (TID) | ORAL | 0 refills | Status: AC | PRN

## 2022-07-04 MED ORDER — CYCLOBENZAPRINE HCL 10 MG PO TABS: 10.0000 mg | ORAL_TABLET | Freq: Three times a day (TID) | ORAL | 0 refills | Status: AC

## 2022-07-04 NOTE — ED Provider Notes (Signed)
[facility]    CSN: [id number] Arrival date & time: 07/04/22  0805      History   Chief Complaint Chief Complaint  Patient presents with   Flank Pain    HPI Brian Harding is a 36 y.o. male.   Patient is here for right sided flank pain.  Started over a week ago.  Constant, but getting worse.  Right now it hurts, at a 4/10, but when it gets worse can be an 8/10.  Worse with movements.  No n/v.  No fevers/chills.  No known injury per se.  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  No urinary symptoms.  No h/o kidney stones.  Normal Bms overall.        Past Medical History:  Diagnosis Date   Allergy    Degenerative disc disease, lumbar    Prediabetes     Patient Active Problem List   Diagnosis Date Noted   Obesity, unspecified 03/17/2014    History reviewed. No pertinent surgical history.     Home Medications    Prior to Admission medications   Medication Sig Start Date End Date Taking? Authorizing Provider  ibuprofen (ADVIL) 800 MG tablet Take 1 tablet (800 mg total) by mouth every 8 (eight) hours as needed (pain). 03/31/22  Yes Zenia Resides, MD  diclofenac Sodium (VOLTAREN) 1 % GEL Apply 4 g topically 4 (four) times daily. 08/30/21   Rhys Martini, PA-C    Family History Family History  Problem Relation Age of Onset   Diabetes Mother    Hypertension Other     Social History Social History   Tobacco Use   Smoking status: Former    Types: Cigarettes   Smokeless tobacco: Former   Tobacco comments:    E cigs/ Quit 11/2014   Vaping Use   Vaping Use: Every day  Substance Use Topics   Alcohol use: Yes    Alcohol/week: 0.0 standard drinks of alcohol    Comment: occassional   Drug use: No     Allergies   Patient has no known allergies.   Review of Systems Review of Systems  Constitutional: Negative.   HENT: Negative.    Respiratory: Negative.    Cardiovascular: Negative.   Gastrointestinal: Negative.    Genitourinary:  Positive for flank pain.  Psychiatric/Behavioral: Negative.       Physical Exam Triage Vital Signs ED Triage Vitals  Enc Vitals Group     BP 07/04/22 0817 (!) 144/87     Pulse Rate 07/04/22 0817 (!) 55     Resp 07/04/22 0817 16     Temp 07/04/22 0817 97.6 F (36.4 C)     Temp Source 07/04/22 0817 Oral     SpO2 07/04/22 0817 98 %     Weight --      Height --      Head Circumference --      Peak Flow --      Pain Score 07/04/22 0819 8     Pain Loc --      Pain Edu? --      Excl. in GC? --    No data found.  Updated Vital Signs BP (!) 144/87 (BP Location: Left Arm)   Pulse (!) 55   Temp 97.6 F (36.4 C) (Oral)   Resp 16   SpO2 98%   Visual Acuity Right Eye Distance:   Left Eye Distance:   Bilateral Distance:    Right Eye  Near:   Left Eye Near:    Bilateral Near:     Physical Exam Constitutional:      Appearance: Normal appearance.  Cardiovascular:     Rate and Rhythm: Normal rate and regular rhythm.  Pulmonary:     Effort: Pulmonary effort is normal.     Breath sounds: Normal breath sounds.  Abdominal:     General: Bowel sounds are normal.     Palpations: Abdomen is soft.     Tenderness: There is no abdominal tenderness. There is no right CVA tenderness, left CVA tenderness, guarding or rebound.  Musculoskeletal:     Comments: C/o pain at the left flank/mid back with movement;  TTP with deep palpation to the left mid back  Skin:    General: Skin is warm.  Neurological:     General: No focal deficit present.     Mental Status: He is alert.  Psychiatric:        Mood and Affect: Mood normal.      UC Treatments / Results  Labs (all labs ordered are listed, but only abnormal results are displayed) Labs Reviewed  POCT URINALYSIS DIPSTICK, ED / UC    EKG   Radiology No results found.  Procedures Procedures (including critical care time)  Medications Ordered in UC Medications - No data to display  Initial Impression /  Assessment and Plan / UC Course  I have reviewed the triage vital signs and the nursing notes.  Pertinent labs & imaging results that were available during my care of the patient were reviewed by me and considered in my medical decision making (see chart for details).    Final Clinical Impressions(s) / UC Diagnoses   Final diagnoses:  Left flank pain  Muscle strain     Discharge Instructions      You were seen today for left flank pain.  This appears to be muscular in nature.  I have sent out 800mg  advil to take with food, as well as a muscle relaxer to help with pain.  This can make you tired/sleepy so please take when home and not driving.  I recommend heating pad as well.  Please follow up if you are not improving, or develop other symptoms like fever, chills, nausea or vomiting.     ED Prescriptions     Medication Sig Dispense Auth. Provider   ibuprofen (ADVIL) 800 MG tablet Take 1 tablet (800 mg total) by mouth every 8 (eight) hours as needed (pain). 21 tablet Aviella Disbrow, MD   cyclobenzaprine (FLEXERIL) 10 MG tablet Take 1 tablet (10 mg total) by mouth 3 (three) times daily. 20 tablet Rondel Oh, MD      PDMP not reviewed this encounter.   Rondel Oh, MD 07/04/22 [phone]

## 2022-07-04 NOTE — ED Triage Notes (Signed)
Right side pain for a week, getting worse. Pain described as shooting pain with movement and squeezing pressure. States it take his breath away. Reducing Patient range of motion.   No urinary symptoms. Patient states bowel movements are at baseline for him.

## 2022-07-04 NOTE — Discharge Instructions (Addendum)
You were seen today for left flank pain.  This appears to be muscular in nature.  I have sent out 800mg  advil to take with food, as well as a muscle relaxer to help with pain.  This can make you tired/sleepy so please take when home and not driving.  I recommend heating pad as well.  Please follow up if you are not improving, or develop other symptoms like fever, chills, nausea or vomiting.

## 2024-04-04 IMAGING — DX DG SHOULDER 2+V*L*
3 series · 3 of 3 positions shown · non-contrast
Comparison: None Available.

CLINICAL DATA: Left shoulder pain

EXAM:
LEFT SHOULDER - 2+ VIEW

[shoulder neutral ap (1 of 2)]
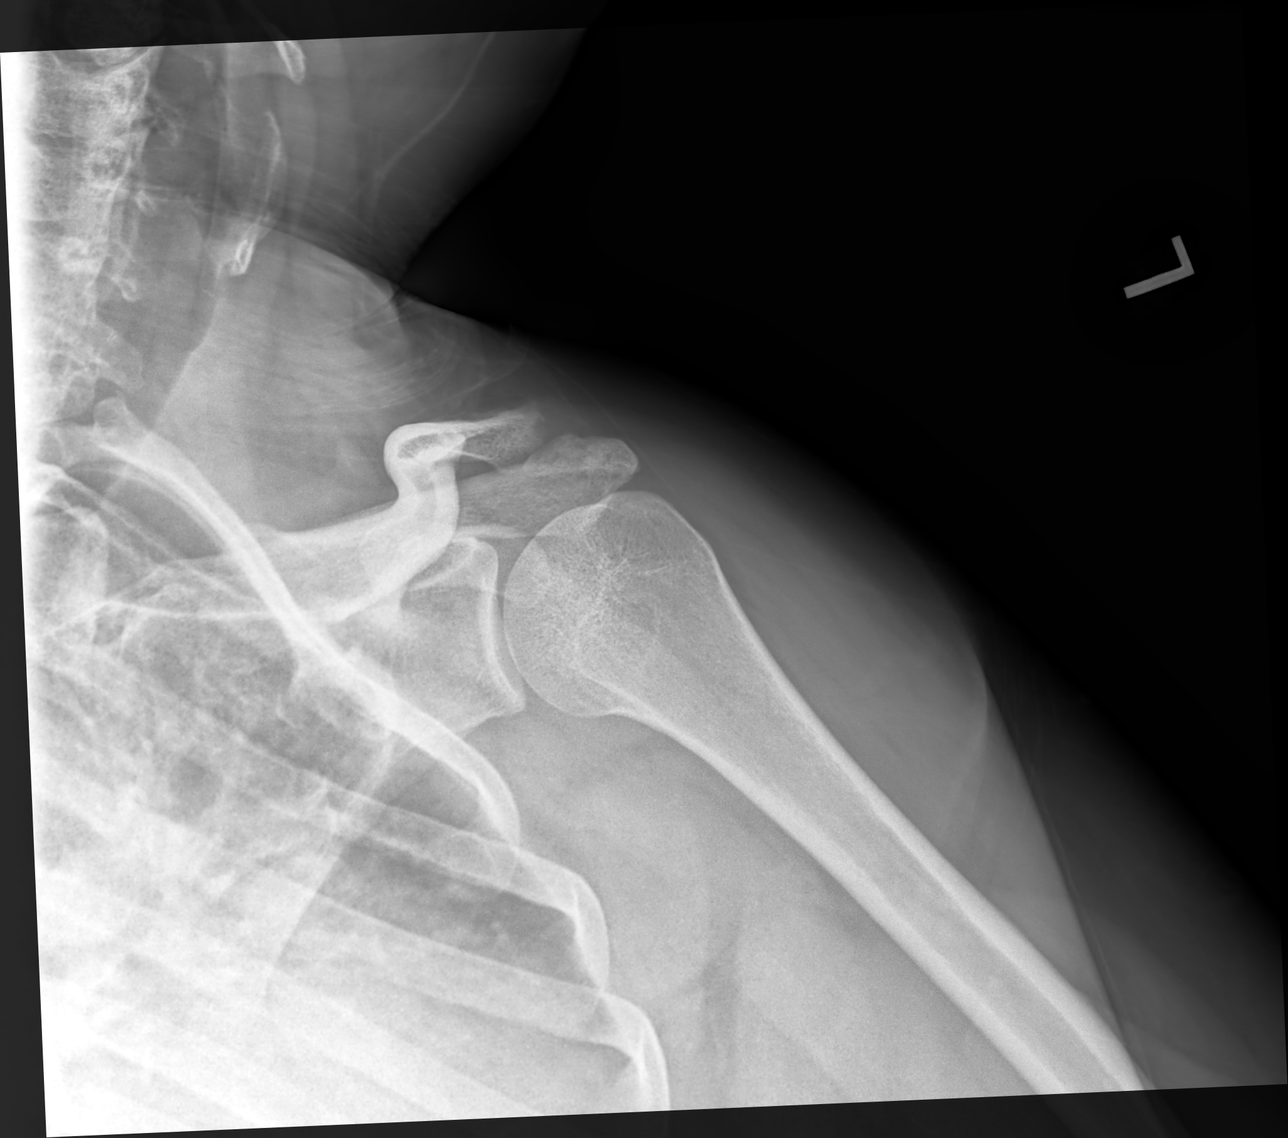

[shoulder abduction (elevation) ap]
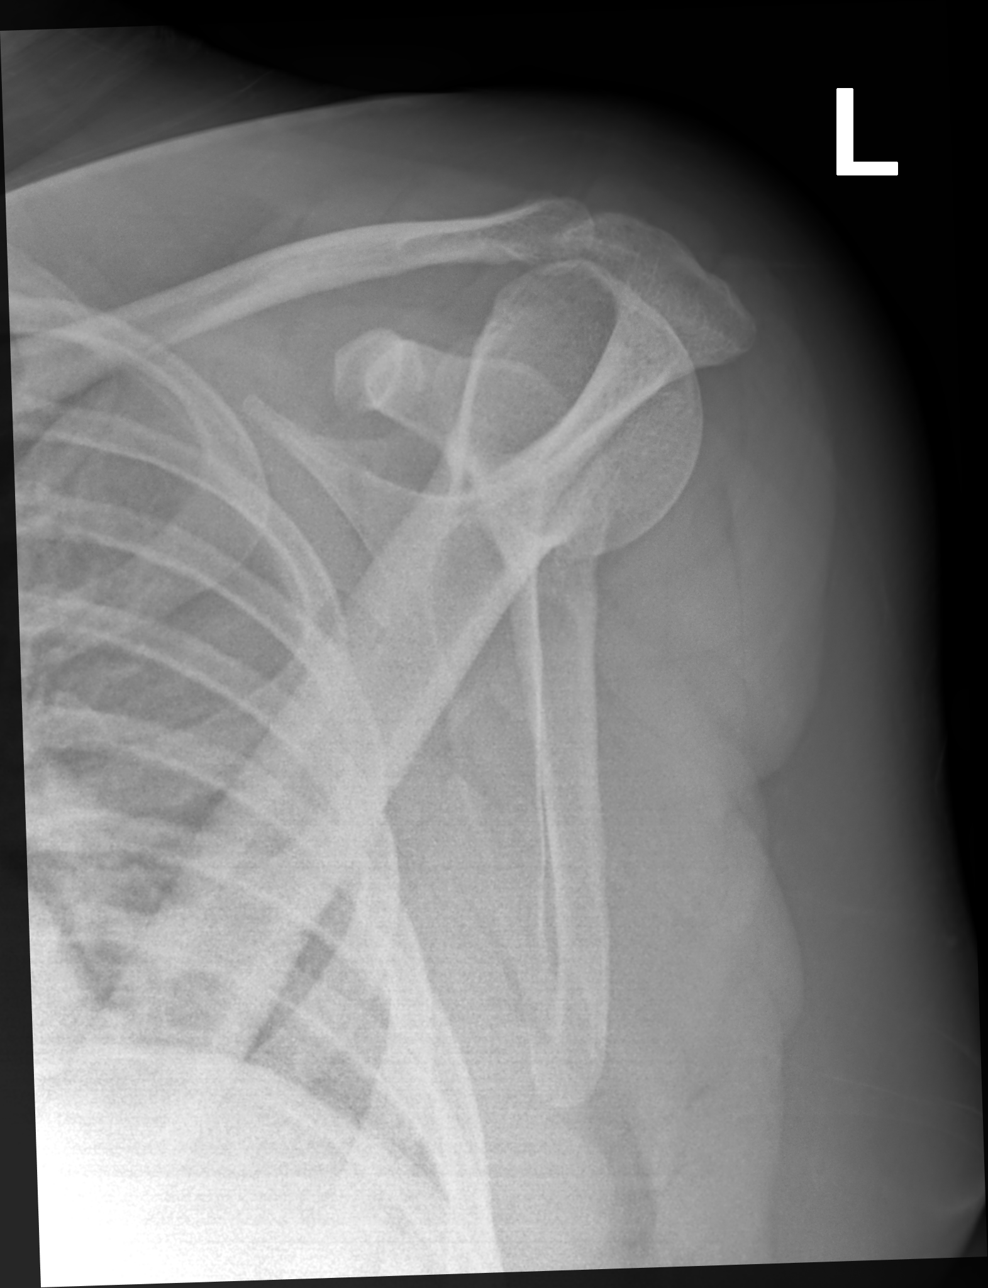

[shoulder neutral ap (2 of 2)]
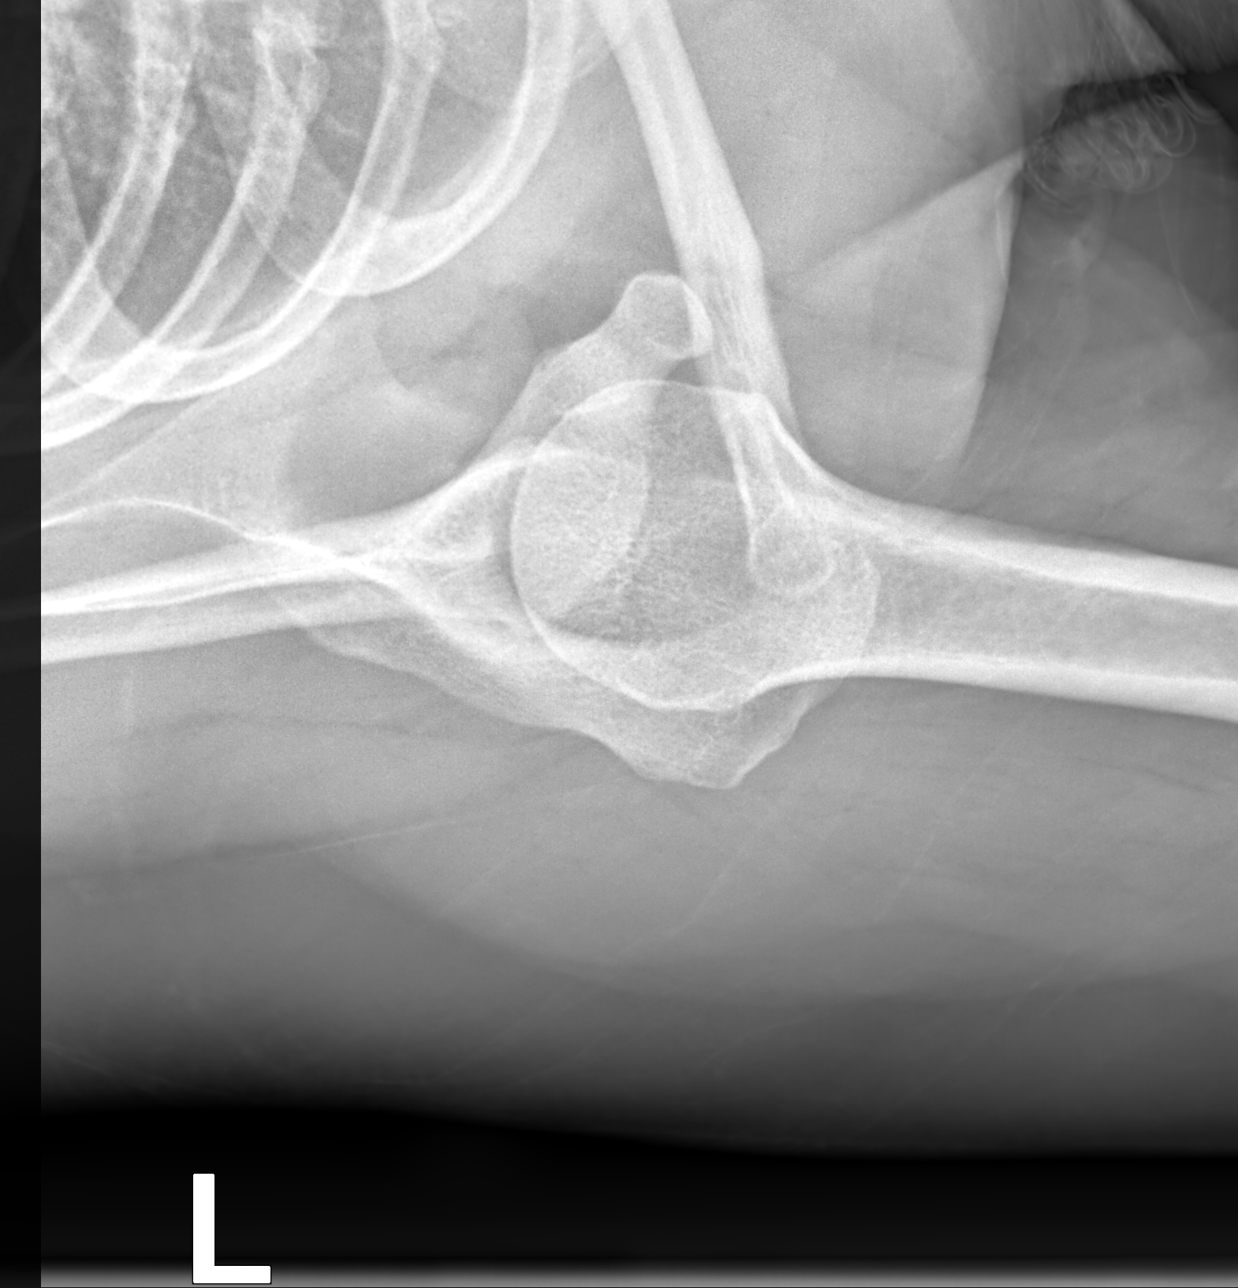

[3 of 3 positions shown; findings below may reference images not displayed]

FINDINGS: There is no evidence of fracture or dislocation. There is no
evidence of arthropathy or other focal bone abnormality. Soft
tissues are unremarkable.
IMPRESSION: Negative.
